# Patient Record
Sex: Female | Born: 1985
Health system: Southern US, Community
[De-identification: ages and names within clinical notes are randomized; demographics above are authoritative.]

## PROBLEM LIST (undated history)

## (undated) DIAGNOSIS — D649 Anemia, unspecified: Secondary | ICD-10-CM

## (undated) DIAGNOSIS — K219 Gastro-esophageal reflux disease without esophagitis: Secondary | ICD-10-CM

## (undated) HISTORY — DX: Gastro-esophageal reflux disease without esophagitis: K21.9

## (undated) HISTORY — DX: Anemia, unspecified: D64.9

---
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
## 2010-09-17 DIAGNOSIS — E559 Vitamin D deficiency, unspecified: Secondary | ICD-10-CM | POA: Insufficient documentation

## 2018-03-06 ENCOUNTER — Encounter: Payer: Self-pay | Admitting: Family Medicine

## 2018-03-06 ENCOUNTER — Ambulatory Visit (INDEPENDENT_AMBULATORY_CARE_PROVIDER_SITE_OTHER): Admitting: Family Medicine

## 2018-03-06 VITALS — BP 114/62 | HR 96 | Temp 98.6°F | Resp 16 | Ht 68.0 in | Wt 189.2 lb

## 2018-03-06 DIAGNOSIS — M25552 Pain in left hip: Secondary | ICD-10-CM | POA: Diagnosis not present

## 2018-03-06 DIAGNOSIS — L659 Nonscarring hair loss, unspecified: Secondary | ICD-10-CM | POA: Diagnosis not present

## 2018-03-06 LAB — CBC
HCT: 37.2 % (ref 36.0–46.0)
Hemoglobin: 12.3 g/dL (ref 12.0–15.0)
MCHC: 33 g/dL (ref 30.0–36.0)
MCV: 92 fl (ref 78.0–100.0)
Platelets: 255 10*3/uL (ref 150.0–400.0)
RBC: 4.04 Mil/uL (ref 3.87–5.11)
RDW: 13.8 % (ref 11.5–15.5)
WBC: 6.5 10*3/uL (ref 4.0–10.5)

## 2018-03-06 LAB — TSH: TSH: 0.9 u[IU]/mL (ref 0.35–4.50)

## 2018-03-06 LAB — IBC PANEL
IRON: 143 ug/dL (ref 42–145)
SATURATION RATIOS: 21.8 % (ref 20.0–50.0)
Transferrin: 468 mg/dL — ABNORMAL HIGH (ref 212.0–360.0)

## 2018-03-06 LAB — FERRITIN: FERRITIN: 7.6 ng/mL — AB (ref 10.0–291.0)

## 2018-03-06 NOTE — Patient Instructions (Addendum)
Ice/cold pack over area for 10-15 min twice daily.  Ibuprofen 400-600 mg (2-3 over the counter strength tabs) every 6 hours as needed for pain.  OK to take Tylenol 1000 mg (2 extra strength tabs) or 975 mg (3 regular strength tabs) every 6 hours as needed.  Do not take before working out. Listen to your body. Avoid irritating activities over the next 4-6 weeks. If no better, send me a message and we will get you set up with physical therapy.  Try biotin if your labs are normal. Wait until the results of your labs come back.  Give [place] 2-3 business days to get the results of your labs back. If labs are normal, you will likely receive a letter in the mail unless you have MyChart. This can take longer than 2-3 business days.    Hip Exercises It is normal to feel mild stretching, pulling, tightness, or discomfort as you do these exercises, but you should stop right away if you feel sudden pain or your pain gets worse.  STRETCHING AND RANGE OF MOTION EXERCISES These exercises warm up your muscles and joints and improve the movement and flexibility of your hip. These exercises also help to relieve pain, numbness, and tingling. Exercise A: Hamstrings, Supine  1. Lie on your back. 2. Loop a belt or towel over the ball of your left / rightfoot. The ball of your foot is on the walking surface, right under your toes. 3. Straighten your left / rightknee and slowly pull on the belt to raise your leg. ? Do not let your left / right knee bend while you do this. ? Keep your other leg flat on the floor. ? Raise the left / right leg until you feel a gentle stretch behind your left / right knee or thigh. 4. Hold this position for 30 seconds. 5. Slowly return your leg to the starting position. Repeat2 times. Complete this stretch 3 times per week. Exercise B: Hip Rotators  1. Lie on your back on a firm surface. 2. Hold your left / right knee with your left / right hand. Hold your ankle with your other  hand. 3. Gently pull your left / right knee and rotate your lower leg toward your other shoulder. ? Pull until you feel a stretch in your buttocks. ? Keep your hips and shoulders firmly planted while you do this stretch. 4. Hold this position for 30 seconds. Repeat 2 times. Complete this stretch 3 times per week. Exercise C: V-Sit (Hamstrings and Adductors)  1. Sit on the floor with your legs extended in a large "V" shape. Keep your knees straight during this exercise. 2. Start with your head and chest upright, then bend at your waist to reach for your left foot (position A). You should feel a stretch in your right inner thigh. 3. Hold this position for 30 seconds. Then slowly return to the upright position. 4. Bend at your waist to reach forward (position B). You should feel a stretch behind both of your thighs and knees. 5. Hold this position for 30 seconds. Then slowly return to the upright position. 6. Bend at your waist to reach for your right foot (position C). You should feel a stretch in your left inner thigh. 7. Hold this position for 30 seconds. Then slowly return to the upright position. Repeat A, B, and C 2 times each. Complete this stretch 3 times per week. Exercise D: Lunge (Hip Flexors)  1. Place your left / right  knee on the floor and bend your other knee so that is directly over your ankle. You should be half-kneeling. 2. Keep good posture with your head over your shoulders. 3. Tighten your buttocks to point your tailbone downward. This helps your back to keep from arching too much. 4. You should feel a gentle stretch in the front of your left / right thigh and hip. If you do not feel any resistance, slightly slide your other foot forward and then slowly lunge forward so your knee once again lines up over your ankle. 5. Make sure your tailbone continues to point downward. 6. Hold this position for 30 seconds. Repeat 2 times. Complete this stretch 3 times per  week.  STRENGTHENING EXERCISES These exercises build strength and endurance in your hip. Endurance is the ability to use your muscles for a long time, even after they get tired. Exercise E: Bridge (Hip Extensors)  1. Lie on your back on a firm surface with your knees bent and your feet flat on the floor. 2. Tighten your buttocks muscles and lift your bottom off the floor until the trunk of your body is level with your thighs. ? Do not arch your back. ? You should feel the muscles working in your buttocks and the back of your thighs. If you do not feel these muscles, slide your feet 1-2 inches (2.5-5 cm) farther away from your buttocks. 3. Hold this position for 3 seconds. 4. Slowly lower your hips to the starting position. Repeat for a total of 10 repetitions. 5. Let your muscles relax completely between repetitions. 6. If this exercise is too easy, try doing it with your arms crossed over your chest. Repeat 2 times. Complete this exercise 3 times per week. Exercise F: Straight Leg Raises - Hip Abductors  1. Lie on your side with your left / right leg in the top position. Lie so your head, shoulder, knee, and hip line up with each other. You may bend your bottom knee to help you balance. 2. Roll your hips slightly forward, so your hips are stacked directly over each other and your left / right knee is facing forward. 3. Leading with your heel, lift your top leg 4-6 inches (10-15 cm). You should feel the muscles in your outer hip lifting. ? Do not let your foot drift forward. ? Do not let your knee roll toward the ceiling. 4. Hold this position for 1 second. 5. Slowly return to the starting position. 6. Let your muscles relax completely between repetitions. Repeat for a total of 10 repetitions.  Repeat 2 times. Complete this exercise 3 times per week. Exercise G: Straight Leg Raises - Hip Adductors  1. Lie on your side with your left / right leg in the bottom position. Lie so your head,  shoulder, knee, and hip line up. You may place your upper foot in front to help you balance. 2. Roll your hips slightly forward, so your hips are stacked directly over each other and your left / right knee is facing forward. 3. Tense the muscles in your inner thigh and lift your bottom leg 4-6 inches (10-15 cm). 4. Hold this position for 1 second. 5. Slowly return to the starting position. 6. Let your muscles relax completely between repetitions. Repeat for a total of 10 repetitions. Repeat 2 times. Complete this exercise 3 times per week. Exercise H: Straight Leg Raises - Quadriceps  1. Lie on your back with your left / right leg extended and your  other knee bent. 2. Tense the muscles in the front of your left / right thigh. When you do this, you should see your kneecap slide up or see increased dimpling just above your knee. 3. Tighten these muscles even more and raise your leg 4-6 inches (10-15 cm) off the floor. 4. Hold this position for 3 seconds. 5. Keep these muscles tense as you lower your leg. 6. Relax the muscles slowly and completely between repetitions. Repeat for a total of 10 repetitions. Repeat 2 times. Complete this exercise 3 times per week. Exercise I: Hip Abductors, Standing 1. Tie one end of a rubber exercise band or tubing to a secure surface, such as a table or pole. 2. Loop the other end of the band or tubing around your left / right ankle. 3. Keeping your ankle with the band or tubing directly opposite of the secured end, step away until there is tension in the tubing or band. Hold onto a chair as needed for balance. 4. Lift your left / right leg out to your side. While you do this: ? Keep your back upright. ? Keep your shoulders over your hips. ? Keep your toes pointing forward. ? Make sure to use your hip muscles to lift your leg. Do not "throw" your leg or tip your body to lift your leg. 5. Hold this position for 1 second. 6. Slowly return to the starting  position. Repeat for a total of 10 repetitions. Repeat 2 times. Complete this exercise 3 times per week. Exercise J: Squats (Quadriceps) 1. Stand in a door frame so your feet and knees are in line with the frame. You may place your hands on the frame for balance. 2. Slowly bend your knees and lower your hips like you are going to sit in a chair. ? Keep your lower legs in a straight-up-and-down position. ? Do not let your hips go lower than your knees. ? Do not bend your knees lower than told by your health care provider. ? If your hip pain increases, do not bend as low. 3. Hold this position for 1 second. 4. Slowly push with your legs to return to standing. Do not use your hands to pull yourself to standing. Repeat for a total of 10 repetitions. Repeat 2 times. Complete this exercise 3 times per week. Make sure you discuss any questions you have with your health care provider. Document Released: 09/03/2005 Document Revised: 05/10/2016 Document Reviewed: 08/11/2015 Elsevier Interactive Patient Education  [employer]2018 [employer]

## 2018-03-06 NOTE — Progress Notes (Signed)
Chief Complaint  Patient presents with  . Establish Care    PT let hip poppoing and painful        New Patient Visit SUBJECTIVE: HPI: Kiara Norman is an 31 y.o.female who is being seen for establishing care.  The patient was previously seen at only her GYN's office.  Ever since her 78-month-old son was born, she has been experiencing thinning of her hair.  At first she thought it was related to the hormonal changes from her pregnancy.  Her Mirena was placed shortly after she gave birth.  She had abnormal bleeding for the entire time until it was removed 3 months ago.  Her cycles have been back to normal since going on oral contraceptives.  She does not pull her hair tightly.  She states that her diet is normal otherwise.  No other areas of easy bruising or bleeding.  The patient does has a history of anemia.  She is not currently taking any iron.  She has not tried anything over-the-counter for this.  She has never had her labs checked regarding this.  L hip in the front has been popping for years, 2 weeks ago has been having a pain with it. Has tried NSAIDs and heating pad.  Family history is positive for arthritis in the hips.  No injury or change in activity otherwise.  No new footwear.  When she does Frankenstein's, it will cause pain.  Running does not affect this.  No numbness, tingling, or weakness.  The hip does not catch or lock.  No outer her hip pain.   Past Medical History:  Diagnosis Date  . Anemia    Past Surgical History:  Procedure Laterality Date  . CESAREAN SECTION     x2   Family History  Problem Relation Age of Onset  . Arthritis Mother   . Arthritis Paternal Grandfather     Current Outpatient Medications:  .  norgestimate-ethinyl estradiol (ESTARYLLA) 0.25-35 MG-MCG tablet, Take 1 tablet by mouth daily., Disp: 1 Package, Rfl: 11  ROS Heme: Denies easy bleeding  MSK: +hip pain   OBJECTIVE: BP 114/62   Pulse 96   Temp 98.6 F (37 C) (Oral)   Resp 16    Ht 5\' 8"  (1.727 m)   Wt 189 lb 3.2 oz (85.8 kg)   SpO2 96%   BMI 28.77 kg/m   Constitutional: -  VS reviewed -  Well developed, well nourished, appears stated age -  No apparent distress  Psychiatric: -  Oriented to person, place, and time -  Memory intact -  Affect and mood normal -  Fluent conversation, good eye contact -  Judgment and insight age appropriate  Eye: -  Conjunctivae clear, no discharge -  Pupils symmetric, round, reactive to light  ENMT: -  MMM    Pharynx moist, no exudate, no erythema  Neck: -  No gross swelling, no palpable masses -  Thyroid midline, not enlarged, mobile, no palpable masses  Cardiovascular: -  RRR -  No LE edema  Respiratory: -  Normal respiratory effort, no accessory muscle use, no retraction -  Breath sounds equal, no wheezes, no ronchi, no crackles  Gastrointestinal: -  Bowel sounds normal -  No tenderness, no distention, no guarding, no masses  Neurological:  -  CN II - XII grossly intact -  Sensation grossly intact to light touch, equal bilaterally  Musculoskeletal: -  No clubbing, no cyanosis -  Gait normal -  L hip- No ttp;  ROM appears wnl, Neg Stinchfield, log roll, Ober's, FABBER, FADDIR  Skin: -  No significant lesion on inspection -  Hairline is intact, hair around appears to be thinning, no alopecia -  Warm and dry to palpation   ASSESSMENT/PLAN: Hair thinning - Plan: CBC, Ferritin, IBC panel, TSH  Pain of left hip joint  Orders as above. If labs neg, try biotin.   Stretches/exercises, NSAIDs, Tylenol, ice. Activity as tolerated. Patient should return at earliest convenience for CPE. The patient voiced understanding and agreement to the plan.   Jilda Rocheicholas Paul BirchwoodWendling, DO 03/06/18  10:39 AM

## 2018-04-21 ENCOUNTER — Ambulatory Visit (INDEPENDENT_AMBULATORY_CARE_PROVIDER_SITE_OTHER): Admitting: Family Medicine

## 2018-04-21 ENCOUNTER — Encounter: Payer: Self-pay | Admitting: Family Medicine

## 2018-04-21 VITALS — BP 110/70 | HR 73 | Temp 98.7°F | Ht 67.0 in | Wt 185.2 lb

## 2018-04-21 DIAGNOSIS — M25552 Pain in left hip: Secondary | ICD-10-CM

## 2018-04-21 DIAGNOSIS — Z0000 Encounter for general adult medical examination without abnormal findings: Secondary | ICD-10-CM | POA: Diagnosis not present

## 2018-04-21 LAB — LIPID PANEL
CHOLESTEROL: 179 mg/dL (ref 0–200)
HDL: 56.2 mg/dL (ref >39.00)
LDL Cholesterol: 112 mg/dL — ABNORMAL HIGH (ref 0–99)
NonHDL: 122.38
Total CHOL/HDL Ratio: 3
Triglycerides: 52 mg/dL (ref 0.0–149.0)
VLDL: 10.4 mg/dL (ref 0.0–40.0)

## 2018-04-21 LAB — COMPREHENSIVE METABOLIC PANEL
ALBUMIN: 4.2 g/dL (ref 3.5–5.2)
ALK PHOS: 38 U/L — AB (ref 39–117)
ALT: 21 U/L (ref 0–35)
AST: 22 U/L (ref 0–37)
BILIRUBIN TOTAL: 0.5 mg/dL (ref 0.2–1.2)
BUN: 14 mg/dL (ref 6–23)
CO2: 29 mEq/L (ref 19–32)
CREATININE: 0.85 mg/dL (ref 0.40–1.20)
Calcium: 9.5 mg/dL (ref 8.4–10.5)
Chloride: 104 mEq/L (ref 96–112)
GFR: 83.38 mL/min (ref >60.00)
Glucose, Bld: 91 mg/dL (ref 70–99)
Potassium: 4.5 mEq/L (ref 3.5–5.1)
SODIUM: 138 meq/L (ref 135–145)
TOTAL PROTEIN: 6.9 g/dL (ref 6.0–8.3)

## 2018-04-21 NOTE — Patient Instructions (Addendum)
Stay active and keep the diet clean.  Heat (pad or rice pillow in microwave) over affected area, 10-15 minutes twice daily.   If you do not hear anything about your physical therapy referral in the next 1-2 weeks, call our office and ask for an update.  Give us 2-3 business days to get the results of your labs back.  Ferrous sulfate (over the counter iron) 3 times daily if you can tolerate it. If your hair does not improve over next few months, come back and we will check your iron levels again.   Let us know if you need anything.

## 2018-04-21 NOTE — Progress Notes (Signed)
Chief Complaint  Patient presents with  . Annual Exam     Well Woman Kiara Norman is here for a complete physical.   Her last physical was >1 year ago.  Current diet: in general, a "healthy" diet. Current exercise: Lifting weights, cardio. No LMP recorded. Seatbelt? Yes  Health Maintenance Pap/HPV- Yes 10/28/17- follows with GYN Tetanus- Yes - 3 years ago HIV screening- Yes   Still having pain in L hip. Was improving, compliant with rest and stretches/exercises. Lifted several days ago and got worse. Hurts in groin region and over bone in bottom.  Past Medical History:  Diagnosis Date  . Anemia     Past Surgical History:  Procedure Laterality Date  . CESAREAN SECTION     x2    Medications  Current Outpatient Medications on File Prior to Visit  Medication Sig Dispense Refill  . norgestimate-ethinyl estradiol (ESTARYLLA) 0.25-35 MG-MCG tablet Take 1 tablet by mouth daily. 1 Package 11   Allergies No Known Allergies  Review of Systems: Constitutional:  no unexpected weight changes Eye:  no recent significant change in vision Ear/Nose/Mouth/Throat:  Ears:  no tinnitus or vertigo and no recent change in hearing Nose/Mouth/Throat:  no complaints of nasal congestion, no sore throat Cardiovascular: no chest pain Respiratory:  no cough and no shortness of breath Gastrointestinal:  no abdominal pain, no change in bowel habits GU:  Female: negative for dysuria or pelvic pain Musculoskeletal/Extremities: +L hip pain; otherwise no pain of the joints Integumentary (Skin/Breast):  no abnormal skin lesions reported Neurologic:  no headaches Endocrine:  denies fatigue Hematologic/Lymphatic:  No areas of easy bleeding  Exam BP 110/70 (BP Location: Left Arm, Patient Position: Sitting, Cuff Size: Normal)   Pulse 73   Temp 98.7 F (37.1 C) (Oral)   Ht 5\' 7"  (1.702 m)   Wt 185 lb 4 oz (84 kg)   SpO2 99%   BMI 29.01 kg/m  General:  well developed, well nourished, in no  apparent distress Skin:  no significant moles, warts, or growths Head:  no masses, lesions, or tenderness Eyes:  pupils equal and round, sclera anicteric without injection Ears:  canals without lesions, TMs shiny without retraction, no obvious effusion, no erythema Nose:  nares patent, septum midline, mucosa normal, and no drainage or sinus tenderness Throat/Pharynx:  lips and gingiva without lesion; tongue and uvula midline; non-inflamed pharynx; no exudates or postnasal drainage Neck: neck supple without adenopathy, thyromegaly, or masses Lungs:  clear to auscultation, breath sounds equal bilaterally, no respiratory distress Cardio:  regular rate and rhythm, no bruits, no LE edema Abdomen:  abdomen soft, nontender; bowel sounds normal; no masses or organomegaly Genital: Defer to GYN Musculoskeletal: L hip: +Stinchfield, +ttp over isch tuberosity on L, neg FABER/FADDIR, logroll, Ober's; symmetrical muscle groups noted without atrophy or deformity Extremities:  no clubbing, cyanosis, or edema, no deformities, no skin discoloration Neuro:  gait normal; deep tendon reflexes normal and symmetric Psych: well oriented with normal range of affect and appropriate judgment/insight  Assessment and Plan  Well adult exam - Plan: Comprehensive metabolic panel, Lipid panel  Pain of left hip joint - Plan: Ambulatory referral to Physical Therapy   Well 32 y.o. female. Counseled on diet and exercise. Needs to take iron.  Start PT, if no improvement will get MRI. F/u in 6 weeks for this. Other orders as above. Follow up in 1 year or prn. The patient voiced understanding and agreement to the plan.  Jilda Rocheicholas Paul CoalingWendling, DO 04/21/18 9:00 AM

## 2018-04-21 NOTE — Progress Notes (Signed)
Pre visit review using our clinic review tool, if applicable. No additional management support is needed unless otherwise documented below in the visit note. 

## 2018-05-04 ENCOUNTER — Telehealth: Payer: Self-pay | Admitting: Family Medicine

## 2018-05-04 NOTE — Telephone Encounter (Signed)
Pt was contacted to schedule appt, mailbox was full, office was unable to leave msg. I contacted the pt provided her with the number to contact them directly to schedule her appt

## 2018-05-04 NOTE — Telephone Encounter (Signed)
Copied from CRM [id number]. Topic: General - Other >> May 04, 2018  9:31 AM Tamela Oddi wrote: Reason for CRM: Patient called to check the status of her referral for PT.  Patient states that she has not heard anything regarding the referral.  CB# [phone]

## 2018-05-26 ENCOUNTER — Ambulatory Visit (HOSPITAL_BASED_OUTPATIENT_CLINIC_OR_DEPARTMENT_OTHER)
Admission: RE | Admit: 2018-05-26 | Discharge: 2018-05-26 | Disposition: A | Discharge status: Home / Self Care | Source: Ambulatory Visit | Attending: Family Medicine | Admitting: Family Medicine

## 2018-05-26 ENCOUNTER — Ambulatory Visit (INDEPENDENT_AMBULATORY_CARE_PROVIDER_SITE_OTHER): Admitting: Family Medicine

## 2018-05-26 ENCOUNTER — Encounter: Payer: Self-pay | Admitting: Family Medicine

## 2018-05-26 VITALS — BP 125/82 | HR 64 | Ht 67.0 in | Wt 188.0 lb

## 2018-05-26 DIAGNOSIS — M25552 Pain in left hip: Secondary | ICD-10-CM | POA: Diagnosis present

## 2018-05-26 NOTE — Patient Instructions (Signed)
Get x-rays downstairs as you leave today - we will contact you with results, i'm hopeful and expecting these to be normal. Assuming they are start physical therapy for snapping hip, possible labral tear. Icing 15 minutes at a time as needed. Do home exercises on days you don't go to therapy. Medications are unlikely to make this better faster but you can take tylenol and/or aleve as needed for pain. No PT test - we will reevaluate how you're doing when I see you back. Consider MRI arthrogram of your hip if you're not improving as expected. Follow up with me in 6 weeks.

## 2018-05-28 ENCOUNTER — Encounter: Payer: Self-pay | Admitting: Family Medicine

## 2018-05-28 NOTE — Progress Notes (Signed)
PCP and consultation requested by: Sharlene Dory, DO  Subjective:   HPI: Patient is a 32 y.o. female here for left hip pain.  Patient reports she's had popping of her left hip for about 2 years but didn't start getting pain with this until about 3-4 months ago. Now bothering with running and walking especially. Pain level 2/10, can be sharp. No radiation beyond groin area. Has rare back pain but feels it's unrelated and does not occur with her hip pain. No numbness or tingling. No skin changes.  Past Medical History:  Diagnosis Date  . Anemia     Current Outpatient Medications on File Prior to Visit  Medication Sig Dispense Refill  . norgestimate-ethinyl estradiol (ESTARYLLA) 0.25-35 MG-MCG tablet Take 1 tablet by mouth daily. 1 Package 11   No current facility-administered medications on file prior to visit.     Past Surgical History:  Procedure Laterality Date  . CESAREAN SECTION     x2    No Known Allergies  Social History   Socioeconomic History  . Marital status: Single    Spouse name: Not on file  . Number of children: Not on file  . Years of education: Not on file  . Highest education level: Not on file  Occupational History  . Not on file  Social Needs  . Financial resource strain: Not on file  . Food insecurity:    Worry: Not on file    Inability: Not on file  . Transportation needs:    Medical: Not on file    Non-medical: Not on file  Tobacco Use  . Smoking status: Never Smoker  . Smokeless tobacco: Never Used  Substance and Sexual Activity  . Alcohol use: Not Currently  . Drug use: Never  . Sexual activity: Not on file  Lifestyle  . Physical activity:    Days per week: Not on file    Minutes per session: Not on file  . Stress: Not on file  Relationships  . Social connections:    Talks on phone: Not on file    Gets together: Not on file    Attends religious service: Not on file    Active member of club or organization: Not on  file    Attends meetings of clubs or organizations: Not on file    Relationship status: Not on file  . Intimate partner violence:    Fear of current or ex partner: Not on file    Emotionally abused: Not on file    Physically abused: Not on file    Forced sexual activity: Not on file  Other Topics Concern  . Not on file  Social History Narrative  . Not on file    Family History  Problem Relation Age of Onset  . Arthritis Mother   . Arthritis Paternal Grandfather     BP 125/82   Pulse 64   Ht 5\' 7"  (1.702 m)   Wt 188 lb (85.3 kg)   LMP 05/14/2018   BMI 29.44 kg/m   Review of Systems: See HPI above.     Objective:  Physical Exam:  Gen: NAD, comfortable in exam room  Back: No gross deformity, scoliosis. No paraspinal TTP .  No midline or bony TTP. FROM. Strength LEs 5/5 all muscle groups.   2+ MSRs in patellar and achilles tendons, equal bilaterally. Negative SLRs. Sensation intact to light touch bilaterally.  Left hip: No deformity. FROM with 5/5 strength. No tenderness to palpation. NVI distally. Mild  pain with logroll Negative fabers and piriformis stretches.  Assessment & Plan:  1. Left hip pain - independently reviewed radiographs and no evidence FAI.  Her pain is anterior with associated popping and some pain with logroll.  We discussed snapping hip vs labral tear.  She will start physical therapy and do home exercises for this.  Icing.  Tylenol and/or ibuprofen as needed.  Consider MRI arthrogram if doesn't respond to conservative treatment.  F/u in 6 weeks.

## 2018-06-01 ENCOUNTER — Telehealth: Payer: Self-pay

## 2018-06-01 NOTE — Telephone Encounter (Signed)
Patient will come in tomorrow to pick up office notes.

## 2018-06-01 NOTE — Telephone Encounter (Signed)
Will put in envelope at the front desk.

## 2018-06-01 NOTE — Telephone Encounter (Signed)
Copied from CRM [id number]. Topic: General - Other >> May 26, 2018  2:04 PM Angela Nevin wrote: Reason for CRM: Pt called requesting office notes from her visits with Dr. Carmelia Roller stating that she needs them for [employer] profile. Please advise.

## 2018-06-01 NOTE — Telephone Encounter (Signed)
Ok to provide//has only been 2 visits

## 2018-06-01 NOTE — Telephone Encounter (Signed)
OK 

## 2018-06-01 NOTE — Telephone Encounter (Signed)
Printed both office visit notes//called the patient left message to call back,

## 2018-06-02 ENCOUNTER — Ambulatory Visit: Admitting: Family Medicine

## 2018-06-13 ENCOUNTER — Encounter: Payer: Self-pay | Admitting: Physical Therapy

## 2018-06-13 ENCOUNTER — Ambulatory Visit: Attending: Family Medicine | Admitting: Physical Therapy

## 2018-06-13 ENCOUNTER — Other Ambulatory Visit: Payer: Self-pay

## 2018-06-13 DIAGNOSIS — M25652 Stiffness of left hip, not elsewhere classified: Secondary | ICD-10-CM

## 2018-06-13 DIAGNOSIS — M6281 Muscle weakness (generalized): Secondary | ICD-10-CM | POA: Diagnosis present

## 2018-06-13 DIAGNOSIS — M25552 Pain in left hip: Secondary | ICD-10-CM

## 2018-06-13 NOTE — Patient Instructions (Signed)
         Garen Lah, PT Certified Exercise Expert for the Aging Adult  06/13/18 12:31 PM Phone: [phone] Fax: [phone]

## 2018-06-13 NOTE — Therapy (Addendum)
[facility] Outpatient Rehabilitation [facility] [address] [place], [place], [address] Phone: [phone]   Fax:  [phone]  Physical Therapy Evaluation  Patient Details  Name: Kiara Norman MRN: [id number] Date of Birth: March 06, 1986 Referring Provider (PT): Cathlyn Parsons, MD   Encounter Date: 06/13/2018  PT End of Session - 06/13/18 1248    Visit Number  1    Number of Visits  13    Date for PT Re-Evaluation  07/25/18    Authorization Type  TRICARE  PT only    PT Start Time  1145    PT Stop Time  1235    PT Time Calculation (min)  50 min    Activity Tolerance  Patient tolerated treatment well    Behavior During Therapy  [facility] for tasks assessed/performed       Past Medical History:  Diagnosis Date  . Anemia     Past Surgical History:  Procedure Laterality Date  . CESAREAN SECTION     x2    There were no vitals filed for this visit.   Subjective Assessment - 06/13/18 1149    Subjective  I am in the [facility]. I have been having left hip pain for 2 months. but the snapping and popping has been happening for about a year. 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  unlimited    How long can you stand comfortably?  standing still is fine    How long can you walk comfortably?  < 5 mnintues   with walking or runniing   Diagnostic tests  x rays normal     Patient Stated Goals  goals return to weight lifting and running    Currently in Pain?  Yes    Pain Score  9    2/10 at rest but higher when running   Pain Location  Hip    Pain Orientation  Left    Pain Descriptors / Indicators  Burning;Sharp   with running   Pain Type  Chronic pain    Pain Radiating Towards  radiates into my buttock next to ischial tuberosity, popping anteriorly    Pain Onset  More than a month ago    Pain  Frequency  Intermittent    Aggravating Factors   running, any hip flexor for sit ups     Pain Relieving Factors  try to stretch         [facility] PT Assessment - 06/13/18 1155      Assessment   Medical Diagnosis  left hip pain    Referring Provider (PT)  Cathlyn Parsons, MD    Onset Date/Surgical Date  04/13/18   popping and clicking for a year   Hand Dominance  Right    Next MD Visit  July 05, 2018    Prior Therapy  none      Precautions   Precautions  None      Restrictions   Weight Bearing Restrictions  No      Balance Screen   Has the patient fallen in the past 6 months  No    Has the patient had a decrease in activity level because of a fear of falling?   No    Is the patient reluctant to leave their home because of a fear of  falling?   No      Home Environment   Living Environment  Private residence    Living Arrangements  Children    Type of Home  Apartment    Home Access  Stairs to enter    Entrance Stairs-Number of Steps  14      Prior Function   Level of Independence  Independent    Vocation  Full time employment    Vocation Requirements  LPN [employer] also in the [employer]      Cognition   Overall Cognitive Status  Within Functional Limits for tasks assessed      Observation/Other Assessments   Focus on Therapeutic Outcomes (FOTO)   FOTO intake 50% limtation 50% predicted 28%      Sensation   Light Touch  Appears Intact      Functional Tests   Functional tests  Single leg stance;Running;Squat;Lunges      Squat   Comments  Pt able to squat 90 degrees hip flexion but mentions tightness on left hip      Lunges   Comments  unable to do due to pain in left hip today      Running   Comments  unable to run due to left hip pain      Single Leg Stance   Comments  right 30 sec no pain, left 30 sec but constant aching       ROM / Strength   AROM / PROM / Strength  AROM;Strength      AROM   Overall AROM   Deficits    Right  Hip Extension  10    Right Hip Flexion  120    Right Hip External Rotation   35    Right Hip Internal Rotation   45    Right Hip ABduction  48    Right Hip ADduction  30    Left Hip Extension  0   pain on stretch hip flexor   Left Hip Flexion  110    Left Hip External Rotation   34    Left Hip Internal Rotation   25    Left Hip ABduction  40    Left Hip ADduction  20   pain on groin     Strength   Overall Strength  Deficits    Right Hip Flexion  5/5    Right Hip Extension  5/5    Right Hip External Rotation   5/5    Right Hip Internal Rotation  5/5    Right Hip ABduction  5/5    Left Hip Flexion  4+/5    Left Hip Extension  4-/5   pain in left hip flexor preventing full range   Left Hip External Rotation  4/5    Left Hip Internal Rotation  4/5    Left Hip ABduction  4/5    Right Knee Flexion  5/5    Right Knee Extension  5/5    Left Knee Flexion  5/5    Left Knee Extension  5/5      Palpation   Palpation comment  ER left tightness, pt with hip tightness with ROM and popping clicking with left hip IR and ER. Pain with extension after PROM of hip flexion      Special Tests    Special Tests  Hip Special Tests      Luisa Hart [facility]) Test   Findings  Positive    Side  Left  Comments  Pt also has clicking poping with maneuver      Thomas Test    Findings  Positive    Side  Left    Comments  Pt left hip 20 degrees from horizontal mat.  Pain in hip flexor      Anterior Hip Impingement Test    Findings  Positive    Side   Left    Comments  x ray negative,                 Objective measurements completed on examination: See above findings.      [facility] Adult PT Treatment/Exercise - 06/13/18 0001      Knee/Hip Exercises: Stretches   Other Knee/Hip Stretches  single limb bridge for stretch in painfree motion with left leg extended and posterior tilt x 3 20-30 sec hold       Knee/Hip Exercises: Sidelying   Clams  sidelyng on right 2 x 10 without resistiance  and pain free    Other Sidelying Knee/Hip Exercises  side plank on knees left 2 x 10    Other Sidelying Knee/Hip Exercises  quadriped fire hydrant 2 x 10 left             PT Education - 06/13/18 1257    Education Details  POC Explanation of finding with skeletal model, initial HEP without resistance and pain free    Person(s) Educated  Patient    Methods  Explanation;Demonstration;Verbal cues;Handout;Tactile cues    Comprehension  Verbalized understanding;Returned demonstration       PT Short Term Goals - 06/13/18 1304      PT SHORT TERM GOAL #1   Title  Pt will be independent with initial HEP    Time  2    Period  Weeks    Status  New    Target Date  06/27/18      PT SHORT TERM GOAL #2   Title  Pt will be able to perform hip flexor stretch pain free    Baseline  unable to perform without pain and decreased AROM    Time  2    Period  Weeks    Status  New    Target Date  06/27/18        PT Long Term Goals - 06/13/18 1232      PT LONG TERM GOAL #1   Title  Pt will be independent with advanced HEP    Time  6    Period  Weeks    Status  New    Target Date  07/25/18      PT LONG TERM GOAL #2   Title  Pt will be able to perform hip flexor stretch without pain    Time  6    Period  Weeks    Status  New    Target Date  07/25/18      PT LONG TERM GOAL #3   Title  "Pt will improve L hip flexor strength to >/= 4+/5 with </= 2/10 pain to promote safety with walking/standing activities    Time  6    Period  Weeks    Status  New    Target Date  07/25/18      PT LONG TERM GOAL #4   Title  Pt will be able to return to weight lifting activities utiilizing safety and hip joint protections    Time  6    Period  Weeks    Status  New    Target Date  07/25/18      PT LONG TERM GOAL #5   Title  "FOTO will improve from 50% limitation    to  28 % limtation   indicating improved functional mobility .     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  Pt saw Dr Pearletha Forge and FAI  was rulled out but clicking and popping with special tests indication of left hip labral involvment..  Pt  will benefit from skilled PT to address pain in left hip for trial before pursuing further work up. Pt unable to tolerate any resistance with exercises and has very tight left hip flexor.     Clinical Presentation  Stable    Clinical Decision Making  Low    Rehab Potential  Good    PT Frequency  2x / week    PT Duration  6 weeks    PT Treatment/Interventions  Cryotherapy;Iontophoresis 4mg /ml Dexamethasone;Moist Heat;Ultrasound;Therapeutic exercise;Therapeutic activities;Functional mobility training;Neuromuscular re-education;Patient/family education;Manual techniques;Passive range of motion;Dry needling;Taping;Joint Manipulations    PT Next Visit Plan  review pain free exercise.  Check pelvic levels, Give flexibility as pt can tolerate    PT Home Exercise Plan  HEP without resisistance.  single limb bridge  with post. tilt for pain free left hip flex. clams, side plank, fire hydrant    Consulted and Agree with Plan of Care  Patient       Patient will benefit from skilled therapeutic intervention in order to improve the following deficits and impairments:  Decreased activity tolerance, Decreased mobility, Decreased range of motion, Decreased strength, Impaired flexibility, Pain  Visit Diagnosis: Pain in left hip - Plan: PT plan of care cert/re-cert  Stiffness of left hip, not elsewhere classified - Plan: PT plan of care cert/re-cert  Muscle weakness (generalized) - Plan: PT plan of care cert/re-cert     Problem List Patient Active Problem List   Diagnosis Date Noted  . Hair thinning 03/06/2018  . Pain of left hip  joint 03/06/2018    Garen Lah, PT Certified Exercise Expert for the Aging Adult  06/13/18 5:25 PM Phone: [phone] Fax: [phone]  [facility] Outpatient Rehabilitation [facility] [address] [place], [place], [address] Phone: [phone]   Fax:  [phone]  Name: Maryah Marinaro MRN: [id number] Date of Birth: 11/19/1985

## 2018-06-21 ENCOUNTER — Ambulatory Visit

## 2018-06-21 DIAGNOSIS — M25552 Pain in left hip: Secondary | ICD-10-CM

## 2018-06-21 DIAGNOSIS — M6281 Muscle weakness (generalized): Secondary | ICD-10-CM

## 2018-06-21 DIAGNOSIS — M25652 Stiffness of left hip, not elsewhere classified: Secondary | ICD-10-CM

## 2018-06-21 NOTE — Therapy (Signed)
[facility] Outpatient Rehabilitation [facility] [address] [place], [place], [address] Phone: [phone]   Fax:  [phone]  Physical Therapy Treatment  Patient Details  Name: Kiara Norman MRN: [id number] Date of Birth: 08-18-1986 Referring Provider (PT): Cathlyn Parsons, MD   Encounter Date: 06/21/2018  PT End of Session - 06/21/18 1634    Visit Number  2    Number of Visits  13    Date for PT Re-Evaluation  07/25/18    Authorization Type  TRICARE  PT only    PT Start Time  1545    PT Stop Time  1625    PT Time Calculation (min)  40 min    Activity Tolerance  Patient tolerated treatment well    Behavior During Therapy  [facility] for tasks assessed/performed       Past Medical History:  Diagnosis Date  . Anemia     Past Surgical History:  Procedure Laterality Date  . CESAREAN SECTION     x2    There were no vitals filed for this visit.  Subjective Assessment - 06/21/18 1547    Subjective  Been doing exercises every day.  Nothing aggravating    Pain Score  4     Pain Location  Hip    Pain Orientation  Left    Pain Descriptors / Indicators  Aching;Burning    Pain Type  Chronic pain                       [facility] Adult PT Treatment/Exercise - 06/21/18 1615      Exercises   Exercises  Knee/Hip      Knee/Hip Exercises: Stretches   Piriformis Stretch  2 reps;20 seconds   crossed leg stretch more for internal rotator stretch   Other Knee/Hip Stretches  single limb bridge for stretch in painfree motion with left leg extended and posterior tilt x 3 20-30 sec hold       Knee/Hip Exercises: Aerobic   Elliptical  5 min level 2 incline manual mode      Knee/Hip Exercises: Standing   Hip Abduction  1 set;Left;10 reps;Knee straight    Hip Extension  1 set;Left;10 reps;Knee straight    Lateral Step Up  1 set;Hand Hold: 2;10 reps;Step Height: 6"    Other Standing Knee Exercises  side stepping with red t-band legs straight and squat position     Other Standing Knee Exercises  standing on L R on pad facing rebounder red ball squat position x 20; then facing to R throwing ball to L x 20      Knee/Hip Exercises: Supine   Hip Adduction Isometric  Strengthening;1 set;5 reps   squishy ball between knees     Knee/Hip Exercises: Sidelying   Clams  sidelyng on right 10 without resistiance then x 10 with red t-band    Other Sidelying Knee/Hip Exercises  side plank on knees left x 10    Other Sidelying Knee/Hip Exercises  quadriped fire hydrant x 10 left      Manual Therapy   Manual Therapy  Muscle Energy Technique    Manual therapy comments  Noted L iliac crest lower than R, used hamstring for muscle energy to correct     Muscle Energy Technique  3 x 5 sec hold hamstring activation in hooklying             PT Education - 06/21/18 1634    Education Details  HEP and to not  progress to gym activities yet; wait to see how tolerated activities today    Person(s) Educated  Patient    Methods  Explanation;Demonstration;Handout    Comprehension  Verbalized understanding;Returned demonstration       PT Short Term Goals - 06/13/18 1304      PT SHORT TERM GOAL #1   Title  Pt will be independent with initial HEP    Time  2    Period  Weeks    Status  New    Target Date  06/27/18      PT SHORT TERM GOAL #2   Title  Pt will be able to perform hip flexor stretch pain free    Baseline  unable to perform without pain and decreased AROM    Time  2    Period  Weeks    Status  New    Target Date  06/27/18        PT Long Term Goals - 06/13/18 1232      PT LONG TERM GOAL #1   Title  Pt will be independent with advanced HEP    Time  6    Period  Weeks    Status  New    Target Date  07/25/18      PT LONG TERM GOAL #2   Title  Pt will be able to perform hip flexor stretch without pain    Time  6    Period  Weeks    Status  New    Target Date  07/25/18      PT LONG TERM GOAL #3   Title  "Pt will improve L hip flexor  strength to >/= 4+/5 with </= 2/10 pain to promote safety with walking/standing activities    Time  6    Period  Weeks    Status  New    Target Date  07/25/18      PT LONG TERM GOAL #4   Title  Pt will be able to return to weight lifting activities utiilizing safety and hip joint protections    Time  6    Period  Weeks    Status  New    Target Date  07/25/18      PT LONG TERM GOAL #5   Title  "FOTO will improve from 50% limitation    to  28 % limtation   indicating improved functional mobility .     Time  6    Period  Weeks    Status  New    Target Date  07/25/18            Plan - 06/21/18 1635    Clinical Impression Statement  Patient tolerating initial HEP without increased pain.  Continues to have popping when straightening out L hip from flexed position, but not with standing hip extension.  No popping noted on elliptical.  Able to progress to functional and weight loaded strengthening this session.  Patient eager to resume some gym activities.  Feel needs further assessment of progressing activities prior to resuming some gym activities.  She will continue to benefit from skilled PT to progress to goals.     PT Frequency  2x / week    PT Treatment/Interventions  Cryotherapy;Iontophoresis 4mg /ml Dexamethasone;Moist Heat;Ultrasound;Therapeutic exercise;Therapeutic activities;Functional mobility training;Neuromuscular re-education;Patient/family education;Manual techniques;Passive range of motion;Dry needling;Taping;Joint Manipulations    PT Next Visit Plan  see how tolerated HEP progression and standing functional strenghting.  determine if able to progress to some gym  activities with caution    PT Home Exercise Plan  HEP without resisistance.  single limb bridge  with post. tilt for pain free left hip flex. clams w/ red band, side plank, fire hydrant, figure 4 IR stretch, adductor squeeze    Consulted and Agree with Plan of Care  Patient       Patient will benefit from  skilled therapeutic intervention in order to improve the following deficits and impairments:     Visit Diagnosis: Stiffness of left hip, not elsewhere classified  Muscle weakness (generalized)  Pain in left hip     Problem List Patient Active Problem List   Diagnosis Date Noted  . Hair thinning 03/06/2018  . Pain of left hip joint 03/06/2018    Elray Mcgregor, PT 06/21/2018, 4:41 PM  [facility] [address] [place], [place], [address] Phone: [phone]   Fax:  [phone]  Name: Kailin Principato MRN: [id number] Date of Birth: 08-24-1986

## 2018-06-23 ENCOUNTER — Encounter

## 2018-06-27 ENCOUNTER — Ambulatory Visit: Admitting: Physical Therapy

## 2018-07-03 ENCOUNTER — Encounter: Payer: Self-pay | Admitting: Physical Therapy

## 2018-07-03 ENCOUNTER — Ambulatory Visit: Attending: Family Medicine | Admitting: Physical Therapy

## 2018-07-03 DIAGNOSIS — M25652 Stiffness of left hip, not elsewhere classified: Secondary | ICD-10-CM | POA: Insufficient documentation

## 2018-07-03 DIAGNOSIS — M25552 Pain in left hip: Secondary | ICD-10-CM | POA: Insufficient documentation

## 2018-07-03 DIAGNOSIS — M6281 Muscle weakness (generalized): Secondary | ICD-10-CM | POA: Insufficient documentation

## 2018-07-03 NOTE — Therapy (Addendum)
[place], [name], [address] Phone: [phone]   Fax:  [phone]  Physical Therapy Treatment  Patient Details  Name: Kiara Norman MRN: [id number] Date of Birth: 05-Jul-1986 Referring Provider (PT): Chryl Heck, MD   Encounter Date: 07/03/2018  PT End of Session - 07/03/18 1438    Visit Number  3    Number of Visits  13    Date for PT Re-Evaluation  07/25/18    Authorization Type  TRICARE  PT only    PT Start Time  1330    PT Stop Time  1415    PT Time Calculation (min)  45 min    Activity Tolerance  Patient tolerated treatment well    Behavior During Therapy  [facility] for tasks assessed/performed       Past Medical History:  Diagnosis Date  . Anemia     Past Surgical History:  Procedure Laterality Date  . CESAREAN SECTION     x2    There were no vitals filed for this visit.  Subjective Assessment - 07/03/18 1333    Subjective  On Friday was pushing her daughter on bike and running along with her and had pain in hip which worsened when she stood from a chair later that night. Went away a few hours after. Felt that the pain in the hip would be allieviated if she could just "pop" it because it felt stuck almost.     Pertinent History  nothing remarkable  just 2 C sections    Limitations  Standing;Walking;Other (comment)    How long can you sit comfortably?  unlimited    How long can you stand comfortably?  standing still is fine    How long can you walk comfortably?  < 5 mnintues    Diagnostic tests  x rays normal     Patient Stated Goals  goals return to weight lifting and running    Currently in Pain?  Yes    Pain Score  2     Pain Location  Hip    Pain Orientation  Left    Pain Descriptors / Indicators  Sore    Pain Type  Chronic pain    Pain Radiating Towards  radiates into buttock next to iscial tub, and into groin some, popping anteriorly     Pain Onset  More than a month ago    Pain Frequency  Intermittent    Aggravating Factors   running, any hip flexor for sit ups     Pain Relieving Factors  try to stretch                        [facility] Adult PT Treatment/Exercise - 07/03/18 0001      Lumbar Exercises: Machines for Strengthening   Leg Press  1x10 20# 1x10 40#    Elliptical  5 min level 1      Knee/Hip Exercises: Stretches   Piriformis Stretch  2 reps;20 seconds    Other Knee/Hip Stretches  thomas test stretch 3x30sec      Knee/Hip Exercises: Standing   Hip Abduction  Both;1 set   x10 with red t-band around knees    Hip Extension  Both;1 set;10 reps   x10 with red t-band   Other Standing Knee Exercises  side stepping with red t-band around ankles squat position 2x10    Other Standing Knee Exercises  attempted hip flexion with arms on wall but  immediate pain in left ant groin      Knee/Hip Exercises: Supine   Hip Adduction Isometric  --   hip adduction with bridges 2x10      Knee/Hip Exercises: Sidelying   Clams  2x10    Other Sidelying Knee/Hip Exercises  attempted IR but d/c due to discomfort in hip; sideplaank on knee 2x10    Other Sidelying Knee/Hip Exercises  quadriped fire hydrant x 10 left ; extension in quadruped with knee bent 1x5 without ball squeeze 1x5 with ball squeeze for hamstring activation        Manual Therapy   Manual Therapy  Muscle Energy Technique;Joint mobilization    Joint Mobilization  grade 1 & 2 AP mobs at L hip; LAD L hip    Muscle Energy Technique  3 x 5 sec hold hamstring activation in hooklying             PT Education - 07/03/18 1436    Education Details  HEP; appropriate gym machines avoiding those with excessive hip flexion    Person(s) Educated  Patient    Methods  Explanation;Demonstration;Handout;Verbal cues    Comprehension  Verbalized understanding;Returned demonstration       PT Short Term Goals - 06/13/18 1304      PT SHORT TERM GOAL #1   Title  Pt will be independent with initial  HEP    Time  2    Period  Weeks    Status  New    Target Date  06/27/18      PT SHORT TERM GOAL #2   Title  Pt will be able to perform hip flexor stretch pain free    Baseline  unable to perform without pain and decreased AROM    Time  2    Period  Weeks    Status  New    Target Date  06/27/18        PT Long Term Goals - 07/03/18 1512      PT LONG TERM GOAL #1   Title  Pt will be independent with advanced HEP    Time  6    Period  Weeks    Status  On-going      PT LONG TERM GOAL #2   Title  Pt will be able to perform hip flexor stretch without pain    Baseline  performed thomas test stretch in supine without any pain     Time  6    Period  Weeks    Status  Partially Met      PT LONG TERM GOAL #3   Title  "Pt will improve L hip flexor strength to >/= 4+/5 with </= 2/10 pain to promote safety with walking/standing activities    Time  6    Period  Weeks    Status  On-going      PT LONG TERM GOAL #4   Title  Pt will be able to return to weight lifting activities utiilizing safety and hip joint protections    Time  6    Period  Weeks    Status  On-going      PT LONG TERM GOAL #5   Title  "FOTO will improve from 50% limitation    to  28 % limtation   indicating improved functional mobility .     Time  6    Period  Weeks    Status  On-going  Plan - 07/03/18 1439    Clinical Impression Statement  Patient reports "snapping" pain in anterior hip when passively moving L hip from flexion to extension and groin pain with active hip flexion in standing. Patient with hip flare up over the weekend when running after daughter on bike that didn't last longer than that envening. Patient reports pain relief with grade 1 &2 AP and inf mobs on L hip. Continued to progress hip strengthening exercises without any reports of pain.     Clinical Presentation  Stable    Clinical Decision Making  Low    Rehab Potential  Good    PT Frequency  2x / week    PT Duration  6  weeks    PT Treatment/Interventions  Cryotherapy;Iontophoresis 29m/ml Dexamethasone;Moist Heat;Ultrasound;Therapeutic exercise;Therapeutic activities;Functional mobility training;Neuromuscular re-education;Patient/family education;Manual techniques;Passive range of motion;Dry needling;Taping;Joint Manipulations    PT Next Visit Plan  see how tolerated HEP progression and standing functional strenghting. Grade 1& 2 AP and inf mobs. Continue to progress gym activites on machines if able    PT Home Exercise Plan  HEP without resisistance.  single limb bridge  with post. tilt for pain free left hip flex. clams w/ red band, side plank, fire hydrant, figure 4 IR stretch, adductor squeeze. Hip extension in quadraped with ball squeeze in between knee, thomas test stretch    Consulted and Agree with Plan of Care  Patient       Patient will benefit from skilled therapeutic intervention in order to improve the following deficits and impairments:  Decreased activity tolerance, Decreased mobility, Decreased range of motion, Decreased strength, Impaired flexibility, Pain  Visit Diagnosis: Stiffness of left hip, not elsewhere classified  Muscle weakness (generalized)  Pain in left hip     Problem List Patient Active Problem List   Diagnosis Date Noted  . Hair thinning 03/06/2018  . Pain of left hip joint 03/06/2018   DCarolyne LittlesPT DPT  07/03/2018   KEinar CrowSPT 07/03/2018, 3:15 PM  During this treatment session, the therapist was present, participating in and directing the treatment.   C[place]onG[place] [address]Phone: [phone]  Fax:  [phone] Name: Kiara SoderbergMRN: [id number] of Birth: 6September 03, 1987

## 2018-07-07 ENCOUNTER — Ambulatory Visit: Admitting: Physical Therapy

## 2018-07-07 ENCOUNTER — Ambulatory Visit: Admitting: Family Medicine

## 2018-07-07 ENCOUNTER — Encounter: Payer: Self-pay | Admitting: Physical Therapy

## 2018-07-07 DIAGNOSIS — M6281 Muscle weakness (generalized): Secondary | ICD-10-CM

## 2018-07-07 DIAGNOSIS — M25552 Pain in left hip: Secondary | ICD-10-CM

## 2018-07-07 DIAGNOSIS — M25652 Stiffness of left hip, not elsewhere classified: Secondary | ICD-10-CM

## 2018-07-07 NOTE — Therapy (Signed)
[name], [name], [address] Phone: [phone]   Fax:  [phone]  Physical Therapy Treatment  Patient Details  Name: Kiara Norman MRN: [id number] Date of Birth: 19-Dec-1985 Referring Provider (PT): Chryl Heck, MD   Encounter Date: 07/07/2018  PT End of Session - 07/07/18 1127    Visit Number  4    Number of Visits  13    Date for PT Re-Evaluation  07/25/18    Authorization Type  [employer]  PT only    PT Start Time  1018    PT Stop Time  1101    PT Time Calculation (min)  43 min    Activity Tolerance  Patient tolerated treatment well    Behavior During Therapy  [facility] for tasks assessed/performed       Past Medical History:  Diagnosis Date  . Anemia     Past Surgical History:  Procedure Laterality Date  . CESAREAN SECTION     x2    There were no vitals filed for this visit.  Subjective Assessment - 07/07/18 1032    Subjective  Has some sorenes with HEP but no increase in pain. Feels that quad stretch is helping alot the next day post exercise soreness. Still with popping/snapping when extending leg. Feels that pain is improving overall.     Pertinent History  nothing remarkable  just 2 C sections    Limitations  Standing;Walking;Other (comment)    How long can you sit comfortably?  unlimited    How long can you stand comfortably?  standing still is fine    How long can you walk comfortably?  < 5 mnintues    Diagnostic tests  x rays normal     Patient Stated Goals  goals return to weight lifting and running    Currently in Pain?  Yes    Pain Score  2     Pain Location  Hip    Pain Orientation  Left    Pain Descriptors / Indicators  Sore    Pain Type  Chronic pain    Pain Radiating Towards  radiates into buttock next to iscial tub, and into groin some, popping anteriorly     Pain Onset  More than a month ago    Pain Frequency  Intermittent    Aggravating Factors   running, any  hipflexor for sit ups    Pain Relieving Factors  try to stretch                        [facility] Adult PT Treatment/Exercise - 07/07/18 0001      Lumbar Exercises: Machines for Strengthening   Leg Press  1x10 40# ; 1x10 60#      Knee/Hip Exercises: Stretches   Piriformis Stretch  1 rep;10 seconds   painful in front of hip   Other Knee/Hip Stretches  thomas test stretch 3x30sec ; firgure 4 stretch ; figure four stretch 2x30sec      Knee/Hip Exercises: Aerobic   Elliptical  5 min level 2      Knee/Hip Exercises: Standing   Other Standing Knee Exercises  FWD/BWD Lateral walk outs with 20# 1x10 fwd/bwd 1x8 lateral      Knee/Hip Exercises: Supine   Bridges with Clamshell  2 sets;10 reps    Straight Leg Raises  1 set   d/c after 3 due to pain   Other Supine Knee/Hip Exercises  resisted hip marches in  hooklying blue t-band 2x10       Manual Therapy   Joint Mobilization  Grade 2 & 3 mobs at L hip; discont LAD as increase snapping/popping              PT Education - 07/07/18 1124    Education Details  Reviewed HEP and additional exercises    Person(s) Educated  Patient    Methods  Explanation;Demonstration;Tactile cues;Verbal cues    Comprehension  Verbalized understanding;Returned demonstration;Verbal cues required       PT Short Term Goals - 07/07/18 1141      PT SHORT TERM GOAL #1   Title  Pt will be independent with initial HEP    Time  2    Period  Weeks    Status  On-going      PT SHORT TERM GOAL #2   Title  Pt will be able to perform hip flexor stretch pain free    Baseline  performing daily pain free     Time  2    Period  Weeks    Status  On-going        PT Long Term Goals - 07/07/18 1147      PT LONG TERM GOAL #1   Title  Pt will be independent with advanced HEP    Time  6    Period  Weeks    Status  On-going      PT LONG TERM GOAL #2   Title  Pt will be able to perform hip flexor stretch without pain    Baseline  performed thomas  test stretch in supine without any pain     Time  6    Period  Weeks    Status  Partially Met      PT LONG TERM GOAL #3   Title  "Pt will improve L hip flexor strength to >/= 4+/5 with </= 2/10 pain to promote safety with walking/standing activities    Time  6    Period  Weeks    Status  On-going      PT LONG TERM GOAL #4   Title  Pt will be able to return to weight lifting activities utiilizing safety and hip joint protections    Time  6    Period  Weeks    Status  On-going      PT LONG TERM GOAL #5   Title  "FOTO will improve from 50% limitation    to  28 % limtation   indicating improved functional mobility .     Time  6    Period  Weeks    Status  On-going            Plan - 07/07/18 1127    Clinical Impression Statement  Patient reports improvements in overall pain, but still having popping and snapping with certain movements. Increased snapping sensation with LAD to right hip so discontinued, but still finds relief with grade 3 PA mobs. Progressed hip strengthening with fwd/bwd and  lateral walk outs with 20 pounds.     Clinical Presentation  Stable    Clinical Decision Making  Low    Rehab Potential  Good    PT Frequency  2x / week    PT Duration  6 weeks    PT Treatment/Interventions  Cryotherapy;Iontophoresis 106m/ml Dexamethasone;Moist Heat;Ultrasound;Therapeutic exercise;Therapeutic activities;Functional mobility training;Neuromuscular re-education;Patient/family education;Manual techniques;Passive range of motion;Dry needling;Taping;Joint Manipulations    PT Next Visit Plan  see how tolerated HEP progression  and standing functional strenghting. Grade 3& 4 AP and inf mobs. Continue to progress gym activites on machines if able    PT Home Exercise Plan  HEP without resisistance.  single limb bridge  with post. tilt for pain free left hip flex. clams w/ red band, side plank, fire hydrant, figure 4 IR stretch, adductor squeeze. Hip extension in quadraped with ball squeeze  in between knee, thomas test stretch       Patient will benefit from skilled therapeutic intervention in order to improve the following deficits and impairments:  Decreased activity tolerance, Decreased mobility, Decreased range of motion, Decreased strength, Impaired flexibility, Pain  Visit Diagnosis: Stiffness of left hip, not elsewhere classified  Muscle weakness (generalized)  Pain in left hip     Problem List Patient Active Problem List   Diagnosis Date Noted  . Hair thinning 03/06/2018  . Pain of left hip joint 03/06/2018    Carney Living 07/07/2018, 11:53 AM   Einar Crow SPT  07/07/2018   During this treatment session, the therapist was present, participating in and directing the treatment. [place], [name], [address] Phone: [phone]   Fax:  [phone]  Name: Kiara Norman MRN: [id number] Date of Birth: Apr 24, 1986

## 2018-07-07 NOTE — Therapy (Signed)
[place], [name], [address] Phone: [phone]   Fax:  [phone]  Physical Therapy Treatment  Patient Details  Name: Kiara Norman MRN: [id number] Date of Birth: 1985/11/03 Referring Provider (PT): Chryl Heck, MD   Encounter Date: 07/07/2018  PT End of Session - 07/07/18 1127    Visit Number  4    Number of Visits  13    Date for PT Re-Evaluation  07/25/18    Authorization Type  [employer]  PT only    PT Start Time  1018    PT Stop Time  1101    PT Time Calculation (min)  43 min    Activity Tolerance  Patient tolerated treatment well    Behavior During Therapy  [facility] for tasks assessed/performed       Past Medical History:  Diagnosis Date  . Anemia     Past Surgical History:  Procedure Laterality Date  . CESAREAN SECTION     x2    There were no vitals filed for this visit.  Subjective Assessment - 07/07/18 1032    Subjective  Has some sorenes with HEP but no increase in pain. Feels that quad stretch is helping alot the next day post exercise soreness. Still with popping/snapping when extending leg. Feels that pain is improving overall.     Pertinent History  nothing remarkable  just 2 C sections    Limitations  Standing;Walking;Other (comment)    How long can you sit comfortably?  unlimited    How long can you stand comfortably?  standing still is fine    How long can you walk comfortably?  < 5 mnintues    Diagnostic tests  x rays normal     Patient Stated Goals  goals return to weight lifting and running    Currently in Pain?  Yes    Pain Score  2     Pain Location  Hip    Pain Orientation  Left    Pain Descriptors / Indicators  Sore    Pain Type  Chronic pain    Pain Radiating Towards  radiates into buttock next to iscial tub, and into groin some, popping anteriorly     Pain Onset  More than a month ago    Pain Frequency  Intermittent    Aggravating Factors   running, any  hipflexor for sit ups    Pain Relieving Factors  try to stretch                        [facility] Adult PT Treatment/Exercise - 07/07/18 0001      Lumbar Exercises: Machines for Strengthening   Leg Press  1x10 40# ; 1x10 60#      Knee/Hip Exercises: Stretches   Piriformis Stretch  1 rep;10 seconds   painful in front of hip   Other Knee/Hip Stretches  thomas test stretch 3x30sec ; firgure 4 stretch ; figure four stretch 2x30sec      Knee/Hip Exercises: Aerobic   Elliptical  5 min level 2      Knee/Hip Exercises: Standing   Other Standing Knee Exercises  FWD/BWD Lateral walk outs with 20# 1x10 fwd , 1x10bwd 1x8 lateral R and L      Knee/Hip Exercises: Supine   Bridges with Clamshell  2 sets;10 reps    Straight Leg Raises  1 set   d/c after 3 due to pain   Other Supine Knee/Hip Exercises  resisted hip marches in hooklying blue t-band 2x10       Manual Therapy   Joint Mobilization  Grade 2 & 3 mobs at L hip; discont LAD as increase snapping/popping              PT Education - 07/07/18 1124    Education Details  Reviewed HEP and additional exercises    Person(s) Educated  Patient    Methods  Explanation;Demonstration;Tactile cues;Verbal cues    Comprehension  Verbalized understanding;Returned demonstration;Verbal cues required       PT Short Term Goals - 07/07/18 1141      PT SHORT TERM GOAL #1   Title  Pt will be independent with initial HEP    Time  2    Period  Weeks    Status  On-going      PT SHORT TERM GOAL #2   Title  Pt will be able to perform hip flexor stretch pain free    Baseline  performing daily pain free     Time  2    Period  Weeks    Status  On-going        PT Long Term Goals - 07/07/18 1147      PT LONG TERM GOAL #1   Title  Pt will be independent with advanced HEP    Time  6    Period  Weeks    Status  On-going      PT LONG TERM GOAL #2   Title  Pt will be able to perform hip flexor stretch without pain    Baseline   performed thomas test stretch in supine without any pain     Time  6    Period  Weeks    Status  Partially Met      PT LONG TERM GOAL #3   Title  "Pt will improve L hip flexor strength to >/= 4+/5 with </= 2/10 pain to promote safety with walking/standing activities    Time  6    Period  Weeks    Status  On-going      PT LONG TERM GOAL #4   Title  Pt will be able to return to weight lifting activities utiilizing safety and hip joint protections    Time  6    Period  Weeks    Status  On-going      PT LONG TERM GOAL #5   Title  "FOTO will improve from 50% limitation    to  28 % limtation   indicating improved functional mobility .     Time  6    Period  Weeks    Status  On-going            Plan - 07/07/18 1127    Clinical Impression Statement  Patient reports improvements in overall pain, but still having popping and snapping with certain movements. Increased snapping sensation with LAD to right hip so discontinued, but still finds relief with grade 3 PA mobs. Progressed hip strengthening with fwd/bwd and  lateral walk outs with 20 pounds with no reports of pain.     Clinical Presentation  Stable    Clinical Decision Making  Low    Rehab Potential  Good    PT Frequency  2x / week    PT Duration  6 weeks    PT Treatment/Interventions  Cryotherapy;Iontophoresis 34m/ml Dexamethasone;Moist Heat;Ultrasound;Therapeutic exercise;Therapeutic activities;Functional mobility training;Neuromuscular re-education;Patient/family education;Manual techniques;Passive range of motion;Dry needling;Taping;Joint Manipulations    PT  Next Visit Plan  see how tolerated HEP progression and standing functional strenghting. Grade 3& 4 AP and inf mobs. Continue to progress gym activites on machines if able    PT Home Exercise Plan  HEP without resisistance.  single limb bridge  with post. tilt for pain free left hip flex. clams w/ red band, side plank, fire hydrant, figure 4 IR stretch, adductor squeeze. Hip  extension in quadraped with ball squeeze in between knee, thomas test stretch       Patient will benefit from skilled therapeutic intervention in order to improve the following deficits and impairments:  Decreased activity tolerance, Decreased mobility, Decreased range of motion, Decreased strength, Impaired flexibility, Pain  Visit Diagnosis: Stiffness of left hip, not elsewhere classified  Muscle weakness (generalized)  Pain in left hip     Problem List Patient Active Problem List   Diagnosis Date Noted  . Hair thinning 03/06/2018  . Pain of left hip joint 03/06/2018    Einar Crow  SPT 07/07/2018, 11:51 AM  [facility] [address] [place], [name], [address] Phone: [phone]   Fax:  [phone]  Name: Rayleen Wyrick MRN: [id number] Date of Birth: September 28, 1985

## 2018-07-10 ENCOUNTER — Encounter: Payer: Self-pay | Admitting: Physical Therapy

## 2018-07-10 ENCOUNTER — Ambulatory Visit: Admitting: Physical Therapy

## 2018-07-10 DIAGNOSIS — M25652 Stiffness of left hip, not elsewhere classified: Secondary | ICD-10-CM

## 2018-07-10 DIAGNOSIS — M6281 Muscle weakness (generalized): Secondary | ICD-10-CM

## 2018-07-10 DIAGNOSIS — M25552 Pain in left hip: Secondary | ICD-10-CM

## 2018-07-10 NOTE — Therapy (Addendum)
[place], [name], [address] Phone: [phone]   Fax:  [phone]  Physical Therapy Treatment  Patient Details  Name: Kiara Norman MRN: [id number] Date of Birth: 1986/03/30 Referring Provider (PT): Chryl Heck, MD   Encounter Date: 07/10/2018  PT End of Session - 07/10/18 1633    Visit Number  5    Number of Visits  13    Date for PT Re-Evaluation  07/25/18    Authorization Type  [employer]  PT only    PT Start Time  1330    PT Stop Time  1425    PT Time Calculation (min)  55 min    Activity Tolerance  Patient tolerated treatment well    Behavior During Therapy  [facility] for tasks assessed/performed       Past Medical History:  Diagnosis Date  . Anemia     Past Surgical History:  Procedure Laterality Date  . CESAREAN SECTION     x2    There were no vitals filed for this visit.  Subjective Assessment - 07/10/18 1335    Subjective  Patient reports increased soreness and pain secondary to last session. Started Friday afternoon deep in groin area. Over the weekend continued to radiate around posteriorly into buttock region. Also intermittently walked 2 miles [place]urday at church which may have also increased pain and symptoms.     Pertinent History  nothing remarkable  just 2 C sections    Limitations  Standing;Walking;Other (comment)    How long can you sit comfortably?  unlimited    How long can you stand comfortably?  standing still is fine    How long can you walk comfortably?  < 5 mnintues    Diagnostic tests  x rays normal     Patient Stated Goals  goals return to weight lifting and running    Currently in Pain?  Yes    Pain Score  6     Pain Location  Hip    Pain Orientation  Left    Pain Descriptors / Indicators  Sore;Aching    Pain Type  Chronic pain    Pain Radiating Towards  radiating into buttock next to iscial tub, and groin, popping anteriorly with certain movements     Pain  Onset  More than a month ago    Pain Frequency  Intermittent    Aggravating Factors   running, any hip flexor for sit ups    Pain Relieving Factors  try to stretch                       [facility] Adult PT Treatment/Exercise - 07/10/18 0001      Lumbar Exercises: Aerobic   Nustep  5 min L5      Knee/Hip Exercises: Stretches   Other Knee/Hip Stretches  thomas test stretch with IASTM x3 ; figure 4 stretch Bil 3x30sec; adductor butterfly stretch 3x30sec      Knee/Hip Exercises: Supine   Other Supine Knee/Hip Exercises  Bridge 2x10 ; clamshells with green t-band 2x10; SLR increased pain x1 ;      Cryotherapy   Cryotherapy Location  Other (comment)   post buttock, ant groin   Type of Cryotherapy  Ice pack      Manual Therapy   Manual therapy comments  IASTM to proximal lateal quads, TFL, adductors ; soft tissue mobilization to proximal quad and TFL    Joint Mobilization  Grade 2 &  3 PA and lateral mobs at L hip;             PT Education - 07/10/18 1638    Education Details  Light hip stretching and stretching; ice for symptom management     Person(s) Educated  Patient    Methods  Explanation;Demonstration;Tactile cues;Verbal cues    Comprehension  Verbalized understanding;Returned demonstration;Verbal cues required       PT Short Term Goals - 07/10/18 1442      PT SHORT TERM GOAL #1   Title  Pt will be independent with initial HEP    Time  2    Period  Weeks    Status  On-going      PT SHORT TERM GOAL #2   Title  Pt will be able to perform hip flexor stretch pain free    Baseline  performing daily pain free     Time  2    Period  Weeks    Status  On-going        PT Long Term Goals - 07/10/18 1443      PT LONG TERM GOAL #1   Title  Pt will be independent with advanced HEP    Time  6    Period  Weeks    Status  On-going      PT LONG TERM GOAL #2   Title  Pt will be able to perform hip flexor stretch without pain    Baseline  performed thomas  test stretch in supine without any pain     Time  6    Period  Weeks    Status  Partially Met      PT LONG TERM GOAL #3   Title  "Pt will improve L hip flexor strength to >/= 4+/5 with </= 2/10 pain to promote safety with walking/standing activities    Time  6    Period  Weeks    Status  On-going      PT LONG TERM GOAL #4   Title  Pt will be able to return to weight lifting activities utiilizing safety and hip joint protections    Time  6    Period  Weeks    Status  On-going      PT LONG TERM GOAL #5   Title  "FOTO will improve from 50% limitation    to  28 % limtation   indicating improved functional mobility .     Time  6    Period  Weeks    Status  On-going            Plan - 07/10/18 1404    Clinical Impression Statement  Patient reported increased pain and radicular symptoms deep to groin that radiated posteriorly into buttock after increasing strengthening exercises last therapy session. Therapy focused on light stretching and strengthening to decrease pain and tightness. Pt reported anterior groin pain relief with PA mob to left hip and pain relief to posterior buttock with lateral distraction, that only lasted briefly. Placed ice on anterior groin and posterior buttock at end of session with pt reporting it decreased pain at the end.     Clinical Presentation  Stable    Clinical Decision Making  Low    Rehab Potential  Good    PT Frequency  2x / week    PT Duration  6 weeks    PT Treatment/Interventions  Cryotherapy;Iontophoresis 35m/ml Dexamethasone;Moist Heat;Ultrasound;Therapeutic exercise;Therapeutic activities;Functional mobility training;Neuromuscular re-education;Patient/family education;Manual techniques;Passive range of motion;Dry  needling;Taping;Joint Manipulations    PT Next Visit Plan  see how tolerated HEP progression and standing functional strenghting. Grade 3& 4 AP and lateral mobs. Continue to progress gym activites on machines if able    PT Home  Exercise Plan  HEP without resisistance.  single limb bridge  with post. tilt for pain free left hip flex. clams w/ red band, side plank, fire hydrant, figure 4 IR stretch, adductor squeeze. Hip extension in quadraped with ball squeeze in between knee, thomas test stretch    Consulted and Agree with Plan of Care  Patient       Patient will benefit from skilled therapeutic intervention in order to improve the following deficits and impairments:  Decreased activity tolerance, Decreased mobility, Decreased range of motion, Decreased strength, Impaired flexibility, Pain  Visit Diagnosis: Stiffness of left hip, not elsewhere classified  Muscle weakness (generalized)  Pain in left hip     Problem List Patient Active Problem List   Diagnosis Date Noted  . Hair thinning 03/06/2018  . Pain of left hip joint 03/06/2018   Carolyne Littles PT DPT  07/10/2018   Einar Crow SPT 07/10/2018, 4:39 PM   During this treatment session, the therapist was present, participating in and directing the treatment.    [place], [name], [address] Phone: [phone]   Fax:  [phone]  Name: Kiara Norman MRN: [id number] Date of Birth: [birth year]

## 2018-07-14 ENCOUNTER — Ambulatory Visit: Admitting: Physical Therapy

## 2018-07-17 ENCOUNTER — Encounter: Payer: Self-pay | Admitting: Physical Therapy

## 2018-07-17 ENCOUNTER — Ambulatory Visit: Admitting: Physical Therapy

## 2018-07-17 DIAGNOSIS — M25552 Pain in left hip: Secondary | ICD-10-CM

## 2018-07-17 DIAGNOSIS — M6281 Muscle weakness (generalized): Secondary | ICD-10-CM

## 2018-07-17 DIAGNOSIS — M25652 Stiffness of left hip, not elsewhere classified: Secondary | ICD-10-CM

## 2018-07-17 NOTE — Therapy (Addendum)
[place], [name], [address] Phone: [phone]   Fax:  [phone]  Physical Therapy Treatment  Patient Details  Name: Kiara Norman MRN: [id number] Date of Birth: 07-11-1986 Referring Provider (PT): Chryl Heck, MD   Encounter Date: 07/17/2018  PT End of Session - 07/17/18 1603    Visit Number  6    Number of Visits  13    Date for PT Re-Evaluation  07/25/18    Authorization Type  [employer]IC[employer]  PT only    PT Start Time  1330    PT Stop Time  1415    PT Time Calculation (min)  45 min    Activity Tolerance  Patient tolerated treatment well    Behavior During Therapy  [facility] for tasks assessed/performed       Past Medical History:  Diagnosis Date  . Anemia     Past Surgical History:  Procedure Laterality Date  . CESAREAN SECTION     x2    There were no vitals filed for this visit.  Subjective Assessment - 07/17/18 1338    Subjective  Patient reports pain had improved hip with light exercises but accidently caught foot wrong on steps yestiurday and fell down 4 stairs. Believe the way she fell may have increased pain in hip slighly but most pain in wrist where she tried to catch her self.  Radiating ischial pain is gone.     Pertinent History  nothing remarkable  just 2 C sections    Limitations  Standing;Walking;Other (comment)    How long can you sit comfortably?  unlimited    How long can you stand comfortably?  standing still is fine    How long can you walk comfortably?  < 5 mnintues    Diagnostic tests  x rays normal     Patient Stated Goals  goals return to weight lifting and running    Currently in Pain?  Yes    Pain Score  3     Pain Location  Hip    Pain Orientation  Left    Pain Descriptors / Indicators  Sore;Aching    Pain Type  Chronic pain    Pain Radiating Towards  poppin anteriorly when moving from flexion into extension; groin and radiating ischial tub pain occasionally     Pain Onset  More than a month ago    Pain Frequency  Intermittent    Aggravating Factors   running, any hip flexor for sit upts     Pain Relieving Factors  try to stretch                        [facility] Adult PT Treatment/Exercise - 07/17/18 0001      Lumbar Exercises: Machines for Strengthening   Leg Press  1x20# x10; 1x8 40#      Knee/Hip Exercises: Stretches   Other Knee/Hip Stretches  thomas teset stretch with arms overhead 3x30 sesc; firgure 4 stretch 4x30sec       Knee/Hip Exercises: Aerobic   Elliptical  5 min level 3      Knee/Hip Exercises: Standing   Other Standing Knee Exercises  standing SLS ball toss against rebounder 1x10 on level surface; x10 on green foam pad       Knee/Hip Exercises: Supine   Other Supine Knee/Hip Exercises  Bridge x10; bridge with ball squeeze x10 ; ball squeeze x10;  SLR x10 to peak of opposite knee; hip  flexion x10 wit hgreen t-band; SAQ with 2# weights over bolster 2x10      Knee/Hip Exercises: Sidelying   Clams  2x10   green t-band   Other Sidelying Knee/Hip Exercises  IR with pink ball between knees x10       Iontophoresis   Type of Iontophoresis  Dexamethasone    Location  Proximal quads    Dose  1CC    Time  slow release             PT Education - 07/17/18 1602    Education Details  Progress back into strengthening program if pain stays low; Ionto benefits     Person(s) Educated  Patient    Methods  Explanation;Demonstration;Tactile cues;Verbal cues    Comprehension  Verbalized understanding;Returned demonstration;Verbal cues required       PT Short Term Goals - 07/17/18 1610      PT SHORT TERM GOAL #1   Title  Pt will be independent with initial HEP    Time  2    Period  Weeks    Status  On-going      PT SHORT TERM GOAL #2   Title  Pt will be able to perform hip flexor stretch pain free    Baseline  performing daily pain free     Period  Weeks    Status  Achieved        PT Long Term Goals -  07/17/18 1610      PT LONG TERM GOAL #1   Title  Pt will be independent with advanced HEP    Time  6    Period  Weeks    Status  On-going      PT LONG TERM GOAL #2   Title  Pt will be able to perform hip flexor stretch without pain    Baseline  performed thomas test stretch in supine without any pain     Time  6    Period  Weeks    Status  Partially Met      PT LONG TERM GOAL #3   Title  "Pt will improve L hip flexor strength to >/= 4+/5 with </= 2/10 pain to promote safety with walking/standing activities    Baseline  <3/10 pain     Time  6    Period  Weeks    Status  On-going      PT LONG TERM GOAL #4   Title  Pt will be able to return to weight lifting activities utiilizing safety and hip joint protections    Time  6    Period  Weeks    Status  On-going      PT LONG TERM GOAL #5   Title  "FOTO will improve from 50% limitation    to  28 % limtation   indicating improved functional mobility .     Time  6    Period  Weeks    Status  On-going            Plan - 07/17/18 1603    Clinical Impression Statement  Patient repors radicular pain to iscial tub and groin has ceased, and overall hip pain stayed down with light strenghtening and stretching until fell down steps. Reports she fell down steps due to pure clumsiness and not due to hip weakenss. Pt with overall brusied distal wrist flexors, but able to actively move wrist.Encouraged pt to contact MD if pain persists in wrist. Progressed pt back into  hip and quad strengthening exercises with low weights and higher reps. No reports of pain with any exercises today. Trialed iontophoresis patch at end of session over proximal rectus femoris and vastis lateralis to decrease inflammation and pain.     Clinical Presentation  Stable    Clinical Decision Making  Low    Rehab Potential  Good    PT Frequency  2x / week    PT Duration  6 weeks    PT Treatment/Interventions  Cryotherapy;Iontophoresis 24m/ml Dexamethasone;Moist  Heat;Ultrasound;Therapeutic exercise;Therapeutic activities;Functional mobility training;Neuromuscular re-education;Patient/family education;Manual techniques;Passive range of motion;Dry needling;Taping;Joint Manipulations    PT Next Visit Plan  assess how tolerated HEP progression and ionto patch. Grade 3& 4 AP and lateral mobs. Continue to progress gym activites on machines if able    PT Home Exercise Plan  HEP without resisistance.  single limb bridge  with post. tilt for pain free left hip flex. clams w/ red band, side plank, fire hydrant, figure 4 IR stretch, adductor squeeze. Hip extension in quadraped with ball squeeze in between knee, thomas test stretch    Consulted and Agree with Plan of Care  Patient       Patient will benefit from skilled therapeutic intervention in order to improve the following deficits and impairments:  Decreased activity tolerance, Decreased mobility, Decreased range of motion, Decreased strength, Impaired flexibility, Pain  Visit Diagnosis: Stiffness of left hip, not elsewhere classified  Muscle weakness (generalized)  Pain in left hip     Problem List Patient Active Problem List   Diagnosis Date Noted  . Hair thinning 03/06/2018  . Pain of left hip joint 03/06/2018   DCarolyne LittlesPT DPT  07/17/2018   KEinar CrowSPT 07/17/2018, 5:36 PM  During this treatment session, the therapist was present, participating in and directing the treatment.  [place] [address]Phone: [phone]  Fax:  [phone] Name: Kiara TodaroMRN: [id number] of Birth: 6November 06, 1987

## 2018-07-20 ENCOUNTER — Encounter: Payer: Self-pay | Admitting: Physical Therapy

## 2018-07-20 ENCOUNTER — Ambulatory Visit: Admitting: Physical Therapy

## 2018-07-20 DIAGNOSIS — M25652 Stiffness of left hip, not elsewhere classified: Secondary | ICD-10-CM | POA: Diagnosis not present

## 2018-07-20 DIAGNOSIS — M25552 Pain in left hip: Secondary | ICD-10-CM

## 2018-07-20 DIAGNOSIS — M6281 Muscle weakness (generalized): Secondary | ICD-10-CM

## 2018-07-20 NOTE — Therapy (Addendum)
[place], [name], [address] Phone: [phone]   Fax:  [phone]  Physical Therapy Treatment  Patient Details  Name: Kiara Norman MRN: [id number] Date of Birth: 11-Feb-1986 Referring Provider (PT): Chryl Heck, MD   Encounter Date: 07/20/2018  PT End of Session - 07/20/18 1720    Visit Number  7    Number of Visits  13    Date for PT Re-Evaluation  07/25/18    Authorization Type  TRIC[employer]  PT only    PT Start Time  1330    PT Stop Time  1415    PT Time Calculation (min)  45 min    Activity Tolerance  Patient tolerated treatment well    Behavior During Therapy  [facility] for tasks assessed/performed       Past Medical History:  Diagnosis Date  . Anemia     Past Surgical History:  Procedure Laterality Date  . CESAREAN SECTION     x2    There were no vitals filed for this visit.  Subjective Assessment - 07/20/18 1342    Subjective  Pt reports she has not had much hip pain ove rthe past few days. Seen MD about wrist from fall who denied fracture but posible injury to soft tissue. Wearing wrist brace. Driving to [place] for the weekend for monthly drill but believes that she will not have to do much because of hip.    Pertinent History  nothing remarkable  just 2 C sections    Limitations  Standing;Walking;Other (comment)    How long can you sit comfortably?  unlimited    How long can you stand comfortably?  standing still is fine    How long can you walk comfortably?  < 5 mnintues    Diagnostic tests  x rays normal     Patient Stated Goals  goals return to weight lifting and running    Currently in Pain?  No/denies    Pain Score  0-No pain    Pain Orientation  Left    Pain Radiating Towards  popping anteriorly when moving from flexion into extension; groin and radiating iscial tub pain occasionally     Pain Onset  More than a month ago    Pain Frequency  Intermittent    Aggravating Factors    running , any hip flexor for sit ups    Pain Relieving Factors  try to stretch                       [facility] Adult PT Treatment/Exercise - 07/20/18 0001      Knee/Hip Exercises: Aerobic   Elliptical  5 min L3      Knee/Hip Exercises: Supine   Straight Leg Raises  10 reps;2 sets    Other Supine Knee/Hip Exercises  Bridge 2x10 with green tband around leg; clamshells x20; bent knee raises x10 Bil      Iontophoresis   Type of Iontophoresis  Dexamethasone    Location  Proximal quads    Dose  1CC    Time  slow release      Manual Therapy   Manual Therapy  Joint mobilization;Soft tissue mobilization    Manual therapy comments  tiger tail roll to piriformis, glues lateral quads and hamstring; skilled palpation of trigger points    Joint Mobilization  Grade 3& 4 AP hip mobs; lateral hip glide     Soft tissue mobilization  glutes and piriformis  PT Education - 07/20/18 1719    Education Details  Benefits of dry needling; stretches for post needle soreness ; POC     Person(s) Educated  Patient    Methods  Explanation;Demonstration;Verbal cues    Comprehension  Verbalized understanding;Returned demonstration       PT Short Term Goals - 07/17/18 1610      PT SHORT TERM GOAL #1   Title  Pt will be independent with initial HEP    Time  2    Period  Weeks    Status  On-going      PT SHORT TERM GOAL #2   Title  Pt will be able to perform hip flexor stretch pain free    Baseline  performing daily pain free     Period  Weeks    Status  Achieved        PT Long Term Goals - 07/17/18 1610      PT LONG TERM GOAL #1   Title  Pt will be independent with advanced HEP    Time  6    Period  Weeks    Status  On-going      PT LONG TERM GOAL #2   Title  Pt will be able to perform hip flexor stretch without pain    Baseline  performed thomas test stretch in supine without any pain     Time  6    Period  Weeks    Status  Partially Met      PT LONG  TERM GOAL #3   Title  "Pt will improve L hip flexor strength to >/= 4+/5 with </= 2/10 pain to promote safety with walking/standing activities    Baseline  <3/10 pain     Time  6    Period  Weeks    Status  On-going      PT LONG TERM GOAL #4   Title  Pt will be able to return to weight lifting activities utiilizing safety and hip joint protections    Time  6    Period  Weeks    Status  On-going      PT LONG TERM GOAL #5   Title  "FOTO will improve from 50% limitation    to  28 % limtation   indicating improved functional mobility .     Time  6    Period  Weeks    Status  On-going            Plan - 07/20/18 1720    Clinical Impression Statement  Pt reports she has not had any hip pain this week and some soreness following previous therapy session. Dry needling performed on L piriformis with good twitch repsone ilicited. Instructed pt in stretches for post needle soreness and strengthening exercises. Pt beleived iontophoresis patch decreased pain after last session and requested to do again at end of session in proximal lateral quads.      Clinical Presentation  Stable    Clinical Decision Making  Low    Rehab Potential  Good    PT Frequency  2x / week    PT Duration  6 weeks    PT Treatment/Interventions  Cryotherapy;Iontophoresis 37m/ml Dexamethasone;Moist Heat;Ultrasound;Therapeutic exercise;Therapeutic activities;Functional mobility training;Neuromuscular re-education;Patient/family education;Manual techniques;Passive range of motion;Dry needling;Taping;Joint Manipulations    PT Next Visit Plan  assess response to dry needling; progress into pre running drills; possibly treadmill     PT Home Exercise Plan  HEP without resisistance.  single  limb bridge  with post. tilt for pain free left hip flex. clams w/ red band, side plank, fire hydrant, figure 4 IR stretch, adductor squeeze. Hip extension in quadraped with ball squeeze in between knee, thomas test stretch    Consulted and  Agree with Plan of Care  Patient       Patient will benefit from skilled therapeutic intervention in order to improve the following deficits and impairments:  Decreased activity tolerance, Decreased mobility, Decreased range of motion, Decreased strength, Impaired flexibility, Pain  Visit Diagnosis: No diagnosis found.     Problem List Patient Active Problem List   Diagnosis Date Noted  . Hair thinning 03/06/2018  . Pain of left hip joint 03/06/2018   Carolyne Littles PT DPT  07/20/2018   Einar Crow SPT 07/20/2018, 5:33 PM   During this treatment session, the therapist was present, participating in and directing the treatment.  [place], [name], [address] Phone: [phone]   Fax:  [phone]  Name: Kiara Norman MRN: [id number] Date of Birth: 06-23-1986

## 2018-08-01 ENCOUNTER — Ambulatory Visit: Attending: Family Medicine | Admitting: Physical Therapy

## 2018-08-01 DIAGNOSIS — M6281 Muscle weakness (generalized): Secondary | ICD-10-CM | POA: Diagnosis present

## 2018-08-01 DIAGNOSIS — M25652 Stiffness of left hip, not elsewhere classified: Secondary | ICD-10-CM | POA: Insufficient documentation

## 2018-08-01 DIAGNOSIS — M25552 Pain in left hip: Secondary | ICD-10-CM | POA: Insufficient documentation

## 2018-08-01 NOTE — Therapy (Signed)
[place], [name], [address] Phone: [phone]   Fax:  [phone]  Physical Therapy Treatment/Recertification   Patient Details  Name: Kiara Norman MRN: [id number] Date of Birth: 1986/02/27 Referring Provider (PT): Karlton Lemon, MD   Encounter Date: 08/01/2018  PT End of Session - 08/01/18 1155    Visit Number  8    Number of Visits  20    Date for PT Re-Evaluation  09/12/18    Authorization Type  TRICARE  PT only    PT Start Time  1145    PT Stop Time  1230    PT Time Calculation (min)  45 min    Activity Tolerance  Patient tolerated treatment well    Behavior During Therapy  [facility] for tasks assessed/performed       Past Medical History:  Diagnosis Date  . Anemia     Past Surgical History:  Procedure Laterality Date  . CESAREAN SECTION     x2    There were no vitals filed for this visit.  Subjective Assessment - 08/01/18 1154    Subjective  I went to [employer] and I am having pain since activity    Pertinent History  nothing remarkable  just 2 C sections    Limitations  Standing;Walking;Other (comment)    How long can you sit comfortably?  unlimited    How long can you stand comfortably?  standing still is fine    How long can you walk comfortably?  < than 5-7 minutes before getting painful    Diagnostic tests  x rays normal     Patient Stated Goals  goals return to weight lifting and running    Pain Score  5     Pain Location  Hip    Pain Orientation  Left    Pain Descriptors / Indicators  Sore;Aching    Pain Type  Chronic pain    Pain Radiating Towards  popping anteriorly when moving from flexion into extension, groin and radiating ischial tuberosity pain occasionally    Pain Frequency  Intermittent    Aggravating Factors   running, any hip flexors for sit up or [employer] drills         [facility] PT Assessment - 08/01/18 1157      Assessment    Medical Diagnosis  left hip pain    Referring Provider (PT)  Chryl Heck, MD    Onset Date/Surgical Date  15/72/62   popping and clicking for a year   Hand Dominance  Right    Next MD Visit  need to schedule    Prior Therapy  none      Observation/Other Assessments   Focus on Therapeutic Outcomes (FOTO)   FOTO intake 50% limtation 50% predicted 28%   no improvement from eval     Functional Tests   Functional tests  Single leg stance;Running;Squat;Lunges      Squat   Comments  Pt able to squat 90 degrees hip flexion but mentions tightness on left hip      Lunges   Comments  able to lunge with ther ex pad to groung , pt can ascend on left but returning to standing pain in left groin      Running   Comments  unable to run due to left hip pain      Single Leg Stance   Comments  right 30 sec no pain, left 30 sec minimal aching  from eval      ROM / Strength   AROM / PROM / Strength  AROM;Strength      AROM   Right Hip Extension  12    Right Hip Flexion  130    Right Hip External Rotation   35    Right Hip Internal Rotation   45    Right Hip ABduction  50    Right Hip ADduction  35    Left Hip Extension  14    Left Hip Flexion  128    Left Hip External Rotation   35    Left Hip Internal Rotation   25    Left Hip ABduction  40   pain in groin   Left Hip ADduction  35   no pain in groin     Strength   Right Hip Flexion  5/5    Right Hip Extension  5/5    Right Hip External Rotation   5/5    Right Hip Internal Rotation  5/5    Right Hip ABduction  5/5    Left Hip Flexion  4+/5   Pain in anterior hip   Left Hip Extension  4/5   stretching pain  in left hip flexor    Left Hip External Rotation  4/5    Left Hip Internal Rotation  4/5    Left Hip ABduction  4/5    Right Knee Flexion  5/5    Right Knee Extension  5/5    Left Knee Flexion  5/5    Left Knee Extension  5/5      Palpation   Palpation comment  Pt with ER popping decreased since eval but exacerbates when  returning form left hip flexion to extension                   [facility] Adult PT Treatment/Exercise - 08/01/18 0001      Knee/Hip Exercises: Stretches   Other Knee/Hip Stretches  thomas test stretch with arms overhead 3x30 sesc; firgure 4 stretch 4x30sec       Knee/Hip Exercises: Standing   Other Standing Knee Exercises  lunges on Ther ex pad x 10 right and left   pain on ant left hip when returning to standing     Knee/Hip Exercises: Supine   Straight Leg Raises  10 reps;2 sets    Other Supine Knee/Hip Exercises  Bridge 2x10 with green tband around leg; clamshells x20;       Knee/Hip Exercises: Sidelying   Other Sidelying Knee/Hip Exercises  IR with pink ball between knees x10       Manual Therapy   Manual Therapy  Joint mobilization;Soft tissue mobilization    Manual therapy comments  skilled PT for TPDN    Joint Mobilization  Grade 3& 4 AP hip mobs; lateral hip glide     Soft tissue mobilization  left quad/ TFL        Trigger Point Dry Needling - 08/01/18 1201    Consent Given?  Yes    Education Handout Provided  Yes    Muscles Treated Lower Body  Quadriceps;Tensor fascia lata   left side only   Tensor Fascia Lata Response  Palpable increased muscle length    Quadriceps Response  Twitch response elicited;Palpable increased muscle length           PT Education - 08/01/18 1242    Education Details  given handout for TPDN and precautians and aftercare  Person(s) Educated  Patient    Methods  Explanation;Demonstration    Comprehension  Verbalized understanding;Returned demonstration       PT Short Term Goals - 08/01/18 1151      PT SHORT TERM GOAL #1   Title  Pt will be independent with initial HEP    Time  2    Period  Weeks    Status  Achieved      PT SHORT TERM GOAL #2   Title  Pt will be able to perform hip flexor stretch pain free    Baseline  performing daily pain free     Time  2    Period  Weeks    Status  Achieved        PT Long  Term Goals - 08/01/18 1151      PT LONG TERM GOAL #1   Title  Pt will be independent with advanced HEP    Time  6    Period  Weeks    Target Date  09/12/18      PT LONG TERM GOAL #2   Title  Pt will be able to perform hip flexor stretch without pain    Baseline  performed thomas test stretch in supine without any pain  but unable to do lunge without pain    Time  6    Period  Weeks    Status  Partially Met    Target Date  09/12/18      PT LONG TERM GOAL #3   Title  "Pt will improve L hip flexor strength to >/= 4+/5 with </= 2/10 pain to promote safety with walking/standing activities    Baseline  pain in4/10  MMT LE at least 4/5 in all and higher improved from eval but still working on goals    Time  6    Period  Weeks    Status  On-going      PT LONG TERM GOAL #4   Title  Pt will be able to return to weight lifting activities utiilizing safety and hip joint protections    Baseline  Pt unable to lift weights or return to [employer] duties and physical activity due to left anterior hip pain     Time  6    Period  Weeks    Status  On-going      PT LONG TERM GOAL #5   Title  "FOTO will improve from 50% limitation    to  28 % limtation   indicating improved functional mobility .     Baseline  08-02-18 50%limitation   No change from evalauation    Time  6    Period  Weeks    Status  On-going    Target Date  09/12/18            Plan - 08/01/18 1155    Clinical Impression Statement  Ms Stoffers returns for re eval. Pt reports 5/10 pain today and reduces to 4/10 but she still has ongoing soreness in anterior hip after actiivty, elliptical and especially after her [employer] duties.  Pt is still unable to run or return to normal fitness activities.  Pt has increased Hip strength to at least 4/5 in left hip but would benefit from continued strengthening.  Pt is going to recheck with Dr Barbaraann Barthel due to ongoing anterior hip pain.  Pt repsonded well to TPDN last time at  piriformis and consented to TPDN this time to anterior quad/ TFL.  Pt was closely monitored and pain in anterior hip was 4/10 post RX.  Pt will benefit from continued skilled PT for strengthening. and return to her activie lifestyle.  Pt will return to dr Barbaraann Barthel for examination of soft tissue issues.     Clinical Presentation  Stable    Clinical Decision Making  Low    Rehab Potential  Good    PT Frequency  2x / week    PT Duration  6 weeks    PT Treatment/Interventions  Cryotherapy;Iontophoresis 12m/ml Dexamethasone;Moist Heat;Ultrasound;Therapeutic exercise;Therapeutic activities;Functional mobility training;Neuromuscular re-education;Patient/family education;Manual techniques;Passive range of motion;Dry needling;Taping;Joint Manipulations    PT Next Visit Plan   progress into pre running drills; possibly treadmill  RE eval last 08-02-18    PT Home Exercise Plan  HEP without resisistance.  single limb bridge  with post. tilt for pain free left hip flex. clams w/ red band, side plank, fire hydrant, figure 4 IR stretch, adductor squeeze. Hip extension in quadraped with ball squeeze in between knee, thomas test stretch    Consulted and Agree with Plan of Care  Patient       Patient will benefit from skilled therapeutic intervention in order to improve the following deficits and impairments:  Decreased activity tolerance, Decreased mobility, Decreased range of motion, Decreased strength, Impaired flexibility, Pain  Visit Diagnosis: Stiffness of left hip, not elsewhere classified  Muscle weakness (generalized)  Pain in left hip     Problem List Patient Active Problem List   Diagnosis Date Noted  . Hair thinning 03/06/2018  . Pain of left hip joint 03/06/2018  LVoncille Lo PT Certified Exercise Expert for the Aging Adult  08/01/18 12:45 PM Phone: [phone]: 3[facility]  [address]Phone: [phone]  Fax:  [phone] Name: AShemaiah RoundMRN: [id number] of Birth: [birth year]

## 2018-08-01 NOTE — Patient Instructions (Signed)
Trigger Point Dry Needling  . What is Trigger Point Dry Needling (DN)? o DN is a physical therapy technique used to treat muscle pain and dysfunction. Specifically, DN helps deactivate muscle trigger points (muscle knots).  o A thin filiform needle is used to penetrate the skin and stimulate the underlying trigger point. The goal is for a local twitch response (LTR) to occur and for the trigger point to relax. No medication of any kind is injected during the procedure.   . What Does Trigger Point Dry Needling Feel Like?  o The procedure feels different for each individual patient. Some patients report that they do not actually feel the needle enter the skin and overall the process is not painful. Very mild bleeding may occur. However, many patients feel a deep cramping in the muscle in which the needle was inserted. This is the local twitch response.   Marland Kitchen. How Will I feel after the treatment? o Soreness is normal, and the onset of soreness may not occur for a few hours. Typically this soreness does not last longer than two days.  o Bruising is uncommon, however; ice can be used to decrease any possible bruising.  o In rare cases feeling tired or nauseous after the treatment is normal. In addition, your symptoms may get worse before they get better, this period will typically not last longer than 24 hours.   . What Can I do After My Treatment? o Increase your hydration by drinking more water for the next 24 hours. o You may place ice or heat on the areas treated that have become sore, however, do not use heat on inflamed or bruised areas. Heat often brings more relief post needling. o You can continue your regular activities, but vigorous activity is not recommended initially after the treatment for 24 hours. o DN is best combined with other physical therapy such as strengthening, stretching, and other therapies.    Garen LahLawrie Beardsley, PT Certified Exercise Expert for the Aging Adult  08/01/18  12:05 PM Phone: [phone] Fax: [phone]

## 2018-08-08 ENCOUNTER — Ambulatory Visit: Admitting: Physical Therapy

## 2018-08-08 ENCOUNTER — Encounter: Payer: Self-pay | Admitting: Physical Therapy

## 2018-08-08 DIAGNOSIS — M25652 Stiffness of left hip, not elsewhere classified: Secondary | ICD-10-CM

## 2018-08-08 DIAGNOSIS — M25552 Pain in left hip: Secondary | ICD-10-CM

## 2018-08-08 DIAGNOSIS — M6281 Muscle weakness (generalized): Secondary | ICD-10-CM

## 2018-08-08 NOTE — Patient Instructions (Signed)
       Garen LahLawrie Beardsley, PT Certified Exercise Expert for the Aging Adult  08/08/18 3:34 PM Phone: [phone] Fax: [phone]

## 2018-08-08 NOTE — Therapy (Signed)
[place], [name], [address] Phone: [phone]   Fax:  [phone]  Physical Therapy Treatment  Patient Details  Name: Kiara Norman MRN: [id number] Date of Birth: 1986-01-01 Referring Provider (PT): Chryl Heck, MD   Encounter Date: 08/08/2018  PT End of Session - 08/08/18 1500    Visit Number  9    Number of Visits  20    Date for PT Re-Evaluation  09/12/18    Authorization Type  [employer]  PT only    PT Start Time  1500    PT Stop Time  1544    PT Time Calculation (min)  44 min    Activity Tolerance  Patient tolerated treatment well    Behavior During Therapy  [facility] for tasks assessed/performed       Past Medical History:  Diagnosis Date  . Anemia     Past Surgical History:  Procedure Laterality Date  . CESAREAN SECTION     x2    There were no vitals filed for this visit.  Subjective Assessment - 08/08/18 1503    Subjective  I think I am doing better with the dry needling last time but my pain is never better than 3/10    Pertinent History  nothing remarkable  just 2 C sections    Currently in Pain?  No/denies    Pain Score  3     Pain Location  Hip    Pain Orientation  Left    Pain Descriptors / Indicators  Aching;Sore    Pain Type  Chronic pain    Pain Onset  More than a month ago    Pain Frequency  Intermittent                       [facility] Adult PT Treatment/Exercise - 08/08/18 0001      Lumbar Exercises: Aerobic   Elliptical  6 min    warm up     Knee/Hip Exercises: Standing   Other Standing Knee Exercises  R DL  3 x 12   marching with 4 kor fitness band    Other Standing Knee Exercises  lunges on Ther ex pad x 10 right and left, standing hip flexion R and L with 10 lb KB on toes,  standing hip abduction with heavy duty 4KOR fitness  strap 3 x 12 on left   pain on ant left hip when returning to standing     Knee/Hip Exercises: Supine   Other Supine  Knee/Hip Exercises  bridges with 45 # weight,       Knee/Hip Exercises: Sidelying   Other Sidelying Knee/Hip Exercises  IR with pink ball between knees x10       Knee/Hip Exercises: Prone   Other Prone Exercises  q ped fire hydrant with 4 KOR fitness band on left 3 x 12      Manual Therapy   Manual Therapy  Soft tissue mobilization    Soft tissue mobilization  trigger point medical rectus femoris proximal trigger point pressure              PT Education - 08/08/18 1535    Education Details  added more advanced HEP with added weight    Person(s) Educated  Patient    Methods  Explanation;Demonstration;Tactile cues;Verbal cues;Handout    Comprehension  Verbalized understanding;Returned demonstration       PT Short Term Goals - 08/01/18 1151  PT SHORT TERM GOAL #1   Title  Pt will be independent with initial HEP    Time  2    Period  Weeks    Status  Achieved      PT SHORT TERM GOAL #2   Title  Pt will be able to perform hip flexor stretch pain free    Baseline  performing daily pain free     Time  2    Period  Weeks    Status  Achieved        PT Long Term Goals - 08/01/18 1151      PT LONG TERM GOAL #1   Title  Pt will be independent with advanced HEP    Time  6    Period  Weeks    Target Date  09/12/18      PT LONG TERM GOAL #2   Title  Pt will be able to perform hip flexor stretch without pain    Baseline  performed thomas test stretch in supine without any pain  but unable to do lunge without pain    Time  6    Period  Weeks    Status  Partially Met    Target Date  09/12/18      PT LONG TERM GOAL #3   Title  "Pt will improve L hip flexor strength to >/= 4+/5 with </= 2/10 pain to promote safety with walking/standing activities    Baseline  pain in4/10  MMT LE at least 4/5 in all and higher improved from eval but still working on goals    Time  6    Period  Weeks    Status  On-going      PT LONG TERM GOAL #4   Title  Pt will be able to return  to weight lifting activities utiilizing safety and hip joint protections    Baseline  Pt unable to lift weights or return to [employer] duties and physical activity due to left anterior hip pain     Time  6    Period  Weeks    Status  On-going      PT LONG TERM GOAL #5   Title  "FOTO will improve from 50% limitation    to  28 % limtation   indicating improved functional mobility .     Baseline  08-02-18 50%limitation   No change from evalauation    Time  6    Period  Weeks    Status  On-going    Target Date  09/12/18            Plan - 08/08/18 1543    Clinical Impression Statement  Pt doing better at 3/10 pain still in groin specifically medial proximal quad with trigger point. 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  Pt was challenged today with a Team 4KOR heavy wt resistance banc and higher weights with exercises.  Pt is very compliant with exericises but has improved but not eliminated pain in left anterior hip. PT declined TPDN  today and will see if more challenging weiights and loading can help with pain.  Pt to continue with sports emphasis with Carolyne Littles PT     Rehab Potential  Good    PT Frequency  2x / week    PT Duration  6 weeks    PT Treatment/Interventions  Cryotherapy;Iontophoresis 37m/ml Dexamethasone;Moist Heat;Ultrasound;Therapeutic exercise;Therapeutic  activities;Functional mobility training;Neuromuscular re-education;Patient/family education;Manual techniques;Passive range of motion;Dry needling;Taping;Joint Manipulations    PT Next Visit Plan   progress into pre running drills; possibly treadmill  RE eval last 08-02-18  Check to see if she should pursue further testing if pain is 3/10 or highter    PT Home Exercise Plan  HEP without resisistance.  single limb bridge  with post. tilt for pain free left hip flex. clams w/ red band, side plank, fire hydrant, figure 4 IR stretch, adductor squeeze. Hip  extension in quadraped with ball squeeze in between knee, thomas test stretch    Consulted and Agree with Plan of Care  Patient       Patient will benefit from skilled therapeutic intervention in order to improve the following deficits and impairments:  Decreased activity tolerance, Decreased mobility, Decreased range of motion, Decreased strength, Impaired flexibility, Pain  Visit Diagnosis: Stiffness of left hip, not elsewhere classified  Muscle weakness (generalized)  Pain in left hip     Problem List Patient Active Problem List   Diagnosis Date Noted  . Hair thinning 03/06/2018  . Pain of left hip joint 03/06/2018   Voncille Lo, PT Certified Exercise Expert for the Aging Adult  08/08/18 5:30 PM Phone: [phone] Fax: [facility] [address] [place], [name], [address] Phone: [phone]   Fax:  [phone]  Name: Mailynn Everly MRN: [id number] Date of Birth: [birth year]

## 2018-08-11 ENCOUNTER — Ambulatory Visit: Admitting: Physical Therapy

## 2018-08-16 ENCOUNTER — Ambulatory Visit: Admitting: Physical Therapy

## 2018-08-18 ENCOUNTER — Ambulatory Visit: Admitting: Physical Therapy

## 2018-08-18 ENCOUNTER — Ambulatory Visit (INDEPENDENT_AMBULATORY_CARE_PROVIDER_SITE_OTHER): Admitting: Family Medicine

## 2018-08-18 ENCOUNTER — Encounter: Payer: Self-pay | Admitting: Family Medicine

## 2018-08-18 VITALS — BP 119/63 | HR 80 | Ht 68.0 in | Wt 187.0 lb

## 2018-08-18 DIAGNOSIS — M25552 Pain in left hip: Secondary | ICD-10-CM | POA: Diagnosis not present

## 2018-08-18 NOTE — Patient Instructions (Signed)
Continue physical therapy and home exercises - over next 6 weeks as you feel comfortable you can wean to just a home exercise program. Icing 15 minutes at a time as needed. You can take tylenol and/or aleve as needed for pain. Still no PT testing. Consider MRI arthrogram of your hip if you're not improving as expected. Follow up with me in 6 weeks to 3 months for reevaluation.

## 2018-08-21 ENCOUNTER — Ambulatory Visit: Admitting: Physical Therapy

## 2018-08-21 ENCOUNTER — Encounter: Payer: Self-pay | Admitting: Physical Therapy

## 2018-08-21 DIAGNOSIS — M25652 Stiffness of left hip, not elsewhere classified: Secondary | ICD-10-CM | POA: Diagnosis not present

## 2018-08-21 DIAGNOSIS — M6281 Muscle weakness (generalized): Secondary | ICD-10-CM

## 2018-08-21 DIAGNOSIS — M25552 Pain in left hip: Secondary | ICD-10-CM

## 2018-08-22 ENCOUNTER — Encounter: Payer: Self-pay | Admitting: Physical Therapy

## 2018-08-22 NOTE — Therapy (Signed)
[place] Norman, [name], [address] Phone: [phone]   Fax:  [phone]  Physical Therapy Treatment  Patient Details  Name: Kiara Norman MRN: [id number] Date of Birth: 1986-06-08 Referring Provider (PT): Chryl Heck, MD   Encounter Date: 08/21/2018  PT End of Session - 08/21/18 1534    Visit Number  10    Number of Visits  20    Date for PT Re-Evaluation  09/12/18    Authorization Type  [employer]IC[employer]  PT only    PT Start Time  1500    PT Stop Time  1543    PT Time Calculation (min)  43 min    Activity Tolerance  Patient tolerated treatment well    Behavior During Therapy  [facility] for tasks assessed/performed       Past Medical History:  Diagnosis Date  . Anemia     Past Surgical History:  Procedure Laterality Date  . CESAREAN SECTION     x2    There were no vitals filed for this visit.  Subjective Assessment - 08/21/18 1508    Subjective  Patient reports her pain is about the same. She is having pain when she does activity but not too bad when she is not doing any activity. She has been to the MD who advised her to continue with therapy. He feels like even if it is a labral tear the surgeyr will prevent her from being able to go back to normal activity.     Pertinent History  nothing remarkable  just 2 C sections    Limitations  Standing;Walking;Other (comment)    How long can you sit comfortably?  unlimited    How long can you stand comfortably?  standing still is fine    How long can you walk comfortably?  < than 5-7 minutes before getting painful    Diagnostic tests  x rays normal     Patient Stated Goals  goals return to weight lifting and running    Currently in Pain?  Yes  (Pended)     Pain Score  4   (Pended)     Pain Location  Hip  (Pended)     Pain Orientation  Left  (Pended)     Pain Descriptors / Indicators  Aching;Sore  (Pended)     Pain Type  Chronic pain  (Pended)     Pain Onset   More than a month ago  (Pended)     Pain Frequency  Intermittent  (Pended)     Aggravating Factors   running, any type of prolonged activity   (Pended)                        [facility] Adult PT Treatment/Exercise - 08/22/18 0001      Lumbar Exercises: Aerobic   Elliptical  5 min    warm up     Knee/Hip Exercises: Stretches   Piriformis Stretch  3 reps;20 seconds    Other Knee/Hip Stretches  thomas test stretch with arms overhead 3x30 sesc; firgure 4 stretch 4x30sec       Knee/Hip Exercises: Standing   Other Standing Knee Exercises  kettle bell squat 35 lb; kettle bell swing 15lb 2x10; single leg d2 kettle bell single leg 5 lb      Knee/Hip Exercises: Supine   Other Supine Knee/Hip Exercises  Bridge 2x10 blue; clamshell with bridge blue 2x10       Manual Therapy  Manual Therapy  Joint mobilization;Soft tissue mobilization    Manual therapy comments  skilled PT for TPDN    Joint Mobilization  Grade 3& 4 AP hip mobs; lateral hip glide     Soft tissue mobilization  left quad/ TFL , rolling of the anterior hip        Trigger Point Dry Needling - 08/22/18 0903    Consent Given?  Yes    Education Handout Provided  Yes    Quadriceps Response  Twitch response elicited           PT Education - 08/21/18 1532    Education Details  reviewed HEp and symptom mangement    Person(s) Educated  Patient    Methods  Explanation;Demonstration;Tactile cues;Verbal cues    Comprehension  Verbalized understanding;Verbal cues required;Returned demonstration;Tactile cues required       PT Short Term Goals - 08/22/18 0903      PT SHORT TERM GOAL #1   Title  Pt will be independent with initial HEP    Time  2    Period  Weeks    Status  Achieved      PT SHORT TERM GOAL #2   Title  Pt will be able to perform hip flexor stretch pain free    Baseline  performing daily pain free     Time  2    Period  Weeks    Status  Achieved        PT Long Term Goals - 08/01/18 1151       PT LONG TERM GOAL #1   Title  Pt will be independent with advanced HEP    Time  6    Period  Weeks    Target Date  09/12/18      PT LONG TERM GOAL #2   Title  Pt will be able to perform hip flexor stretch without pain    Baseline  performed thomas test stretch in supine without any pain  but unable to do lunge without pain    Time  6    Period  Weeks    Status  Partially Met    Target Date  09/12/18      PT LONG TERM GOAL #3   Title  "Pt will improve L hip flexor strength to >/= 4+/5 with </= 2/10 pain to promote safety with walking/standing activities    Baseline  pain in4/10  MMT LE at least 4/5 in all and higher improved from eval but still working on goals    Time  6    Period  Weeks    Status  On-going      PT LONG TERM GOAL #4   Title  Pt will be able to return to weight lifting activities utiilizing safety and hip joint protections    Baseline  Pt unable to lift weights or return to [employer] duties and physical activity due to left anterior hip pain     Time  6    Period  Weeks    Status  On-going      PT LONG TERM GOAL #5   Title  "FOTO will improve from 50% limitation    to  28 % limtation   indicating improved functional mobility .     Baseline  08-02-18 50%limitation   No change from evalauation    Time  6    Period  Weeks    Status  On-going    Target Date  09/12/18  Plan - 08/22/18 0900    Clinical Impression Statement  Patient had a good twtich respose to needling of the anterior hip. Therapy worked on strengthening and high level stability ecercixes. She reported no pain buyt she ussualy has pain later. Per MD continue strengthening. She did have a pop with kettle bell swing.     Clinical Presentation  Stable    Clinical Decision Making  Low    Rehab Potential  Good    PT Frequency  2x / week    PT Duration  6 weeks    PT Treatment/Interventions  Cryotherapy;Iontophoresis 3m/ml Dexamethasone;Moist Heat;Ultrasound;Therapeutic  exercise;Therapeutic activities;Functional mobility training;Neuromuscular re-education;Patient/family education;Manual techniques;Passive range of motion;Dry needling;Taping;Joint Manipulations    PT Next Visit Plan   progress into pre running drills; possibly treadmill  RE eval last 08-02-18  Check to see if she should pursue further testing if pain is 3/10 or highter    PT Home Exercise Plan  HEP without resisistance.  single limb bridge  with post. tilt for pain free left hip flex. clams w/ red band, side plank, fire hydrant, figure 4 IR stretch, adductor squeeze. Hip extension in quadraped with ball squeeze in between knee, thomas test stretch    Consulted and Agree with Plan of Care  Patient       Patient will benefit from skilled therapeutic intervention in order to improve the following deficits and impairments:  Decreased activity tolerance, Decreased mobility, Decreased range of motion, Decreased strength, Impaired flexibility, Pain  Visit Diagnosis: Stiffness of left hip, not elsewhere classified  Muscle weakness (generalized)  Pain in left hip     Problem List Patient Active Problem List   Diagnosis Date Noted  . Hair thinning 03/06/2018  . Pain of left hip joint 03/06/2018    DCarney LivingPT DPT  08/22/2018, 9:05 AM  [facility] [address]Phone: [phone]  Fax:  [phone] Name: AMykala MccreadyMRN: [id number] of Birth: 603-20-1987

## 2018-08-23 ENCOUNTER — Encounter: Payer: Self-pay | Admitting: Family Medicine

## 2018-08-23 NOTE — Progress Notes (Signed)
PCP and consultation requested by: Sharlene DoryWendling, Nicholas Paul, DO  Subjective:   HPI: Patient is a 32 y.o. female here for left hip pain.  9/27: Patient reports she's had popping of her left hip for about 2 years but didn't start getting pain with this until about 3-4 months ago. Now bothering with running and walking especially. Pain level 2/10, can be sharp. No radiation beyond groin area. Has rare back pain but feels it's unrelated and does not occur with her hip pain. No numbness or tingling. No skin changes.  12/20: Patient reports she's feeling better than last visit. Pain gets up to 5/10 and sharp at times. Doing physical therapy and home exercises, noticing some improvement. Ok with walking and work but worse with exercise. Some popping of hip. No numbness, tingling. No back pain.  Past Medical History:  Diagnosis Date  . Anemia     Current Outpatient Medications on File Prior to Visit  Medication Sig Dispense Refill  . norgestimate-ethinyl estradiol (ESTARYLLA) 0.25-35 MG-MCG tablet Take 1 tablet by mouth daily. 1 Package 11   No current facility-administered medications on file prior to visit.     Past Surgical History:  Procedure Laterality Date  . CESAREAN SECTION     x2    No Known Allergies  Social History   Socioeconomic History  . Marital status: Single    Spouse name: Not on file  . Number of children: Not on file  . Years of education: Not on file  . Highest education level: Not on file  Occupational History  . Not on file  Social Needs  . Financial resource strain: Not on file  . Food insecurity:    Worry: Not on file    Inability: Not on file  . Transportation needs:    Medical: Not on file    Non-medical: Not on file  Tobacco Use  . Smoking status: Never Smoker  . Smokeless tobacco: Never Used  Substance and Sexual Activity  . Alcohol use: Not Currently  . Drug use: Never  . Sexual activity: Not on file  Lifestyle  . Physical  activity:    Days per week: Not on file    Minutes per session: Not on file  . Stress: Not on file  Relationships  . Social connections:    Talks on phone: Not on file    Gets together: Not on file    Attends religious service: Not on file    Active member of club or organization: Not on file    Attends meetings of clubs or organizations: Not on file    Relationship status: Not on file  . Intimate partner violence:    Fear of current or ex partner: Not on file    Emotionally abused: Not on file    Physically abused: Not on file    Forced sexual activity: Not on file  Other Topics Concern  . Not on file  Social History Narrative  . Not on file    Family History  Problem Relation Age of Onset  . Arthritis Mother   . Arthritis Paternal Grandfather     BP 119/63   Pulse 80   Ht 5\' 8"  (1.727 m)   Wt 187 lb (84.8 kg)   BMI 28.43 kg/m   Review of Systems: See HPI above.     Objective:  Physical Exam:  Gen: NAD, comfortable in exam room  Back: No gross deformity, scoliosis. No TTP .  No midline or bony  TTP. FROM without pain. Strength LEs 5/5 all muscle groups.   Negative SLRs. Sensation intact to light touch bilaterally.  Left hip: No deformity. FROM with 5/5 strength. No tenderness to palpation. NVI distally. Minimal pain with logroll Negative fabers and piriformis stretches.  Assessment & Plan:  1. Left hip pain - Radiographs negative.  Consistent with snapping hip vs labral tear.  Mild improvement to date and not severe enough per patient to warrant MRI arthrogram.  Continue physical therapy, home exercises.  Icing, tylenol, aleve if needed.  F/u in 6 weeks to 3 months.

## 2018-08-25 ENCOUNTER — Ambulatory Visit: Admitting: Physical Therapy

## 2018-08-28 ENCOUNTER — Encounter: Payer: Self-pay | Admitting: Physical Therapy

## 2018-08-28 ENCOUNTER — Ambulatory Visit: Admitting: Physical Therapy

## 2018-08-28 DIAGNOSIS — M25652 Stiffness of left hip, not elsewhere classified: Secondary | ICD-10-CM | POA: Diagnosis not present

## 2018-08-28 DIAGNOSIS — M25552 Pain in left hip: Secondary | ICD-10-CM

## 2018-08-28 DIAGNOSIS — M6281 Muscle weakness (generalized): Secondary | ICD-10-CM

## 2018-08-28 NOTE — Therapy (Signed)
[place], [name], [address] Phone: [phone]   Fax:  [phone]  Physical Therapy Treatment  Patient Details  Name: Kiara Norman MRN: [id number] Date of Birth: 1985/10/20 Referring Provider (PT): Chryl Heck, MD   Encounter Date: 08/28/2018  PT End of Session - 08/28/18 0917    Visit Number  11    Number of Visits  20    Date for PT Re-Evaluation  09/12/18    Authorization Type  [employer]  PT only    PT Start Time  0848    PT Stop Time  0930    PT Time Calculation (min)  42 min    Activity Tolerance  Patient tolerated treatment well    Behavior During Therapy  [facility] for tasks assessed/performed       Past Medical History:  Diagnosis Date  . Anemia     Past Surgical History:  Procedure Laterality Date  . CESAREAN SECTION     x2    There were no vitals filed for this visit.  Subjective Assessment - 08/28/18 0911    Subjective  Patient was a little sore after the needling the last visit. She was able to use her stretches and exercises to get her out of pain. She just has a minor achetoday. She missed her last visit 2nd to a family emergency.     Pertinent History  nothing remarkable  just 2 C sections    Limitations  Standing;Walking;Other (comment)    How long can you sit comfortably?  unlimited    How long can you stand comfortably?  standing still is fine    How long can you walk comfortably?  < than 5-7 minutes before getting painful    Diagnostic tests  x rays normal     Patient Stated Goals  goals return to weight lifting and running    Currently in Pain?  No/denies                       [facility] Adult PT Treatment/Exercise - 08/28/18 0001      Lumbar Exercises: Aerobic   Elliptical  5 min    warm up     Knee/Hip Exercises: Stretches   Active Hamstring Stretch  3 reps;20 seconds    Piriformis Stretch  3 reps;20 seconds    Other Knee/Hip Stretches  thomas test  stretch with arms overhead 3x30 sesc; firgure 4 stretch 4x30sec       Knee/Hip Exercises: Supine   Other Supine Knee/Hip Exercises  Bridge 2x10 blue; clamshell with bridge blue 2x10     Other Supine Knee/Hip Exercises  supine march blue x20       Manual Therapy   Manual Therapy  Joint mobilization;Soft tissue mobilization    Joint Mobilization  Grade 3& 4 AP hip mobs; lateral hip glide     Soft tissue mobilization  left quad/ TFL , rolling of the anterior hip              PT Education - 08/28/18 0916    Education Details  reviewed technique with HEP     Person(s) Educated  Patient    Methods  Explanation;Demonstration;Tactile cues;Verbal cues    Comprehension  Verbalized understanding;Returned demonstration;Tactile cues required;Verbal cues required       PT Short Term Goals - 08/28/18 1218      PT SHORT TERM GOAL #1   Title  Pt will be independent with initial HEP  Time  2    Period  Weeks    Status  Achieved      PT SHORT TERM GOAL #2   Title  Pt will be able to perform hip flexor stretch pain free    Baseline  performing daily pain free     Time  2    Period  Weeks    Status  Achieved        PT Long Term Goals - 08/01/18 1151      PT LONG TERM GOAL #1   Title  Pt will be independent with advanced HEP    Time  6    Period  Weeks    Target Date  09/12/18      PT LONG TERM GOAL #2   Title  Pt will be able to perform hip flexor stretch without pain    Baseline  performed thomas test stretch in supine without any pain  but unable to do lunge without pain    Time  6    Period  Weeks    Status  Partially Met    Target Date  09/12/18      PT LONG TERM GOAL #3   Title  "Pt will improve L hip flexor strength to >/= 4+/5 with </= 2/10 pain to promote safety with walking/standing activities    Baseline  pain in4/10  MMT LE at least 4/5 in all and higher improved from eval but still working on goals    Time  6    Period  Weeks    Status  On-going      PT  LONG TERM GOAL #4   Title  Pt will be able to return to weight lifting activities utiilizing safety and hip joint protections    Baseline  Pt unable to lift weights or return to [employer] duties and physical activity due to left anterior hip pain     Time  6    Period  Weeks    Status  On-going      PT LONG TERM GOAL #5   Title  "FOTO will improve from 50% limitation    to  28 % limtation   indicating improved functional mobility .     Baseline  08-02-18 50%limitation   No change from evalauation    Time  6    Period  Weeks    Status  On-going    Target Date  09/12/18            Plan - 08/28/18 1213    Clinical Impression Statement  Patient had no significant increase in pain today. Therapy focused more on signle leg stability and strengthening. Therapy deffered needlingthis visit but continued with manual therapy to the hip.     Clinical Presentation  Stable    Clinical Decision Making  Low    Rehab Potential  Good    PT Frequency  2x / week    PT Duration  6 weeks    PT Treatment/Interventions  Cryotherapy;Iontophoresis 81m/ml Dexamethasone;Moist Heat;Ultrasound;Therapeutic exercise;Therapeutic activities;Functional mobility training;Neuromuscular re-education;Patient/family education;Manual techniques;Passive range of motion;Dry needling;Taping;Joint Manipulations    PT Next Visit Plan  continue with strengthening. Consider TPDN if needed; Continue with manual therapy     PT Home Exercise Plan  HEP without resisistance.  single limb bridge  with post. tilt for pain free left hip flex. clams w/ red band, side plank, fire hydrant, figure 4 IR stretch, adductor squeeze. Hip extension in quadraped with ball squeeze  in between knee, thomas test stretch    Consulted and Agree with Plan of Care  Patient       Patient will benefit from skilled therapeutic intervention in order to improve the following deficits and impairments:  Decreased activity tolerance, Decreased mobility,  Decreased range of motion, Decreased strength, Impaired flexibility, Pain  Visit Diagnosis: Stiffness of left hip, not elsewhere classified  Muscle weakness (generalized)  Pain in left hip     Problem List Patient Active Problem List   Diagnosis Date Noted  . Hair thinning 03/06/2018  . Pain of left hip joint 03/06/2018    Carney Living PT DPT  08/28/2018, 1:01 PM  [facility] [address] [place], [name], [address] Phone: [phone]   Fax:  [phone]  Name: Kiara Norman MRN: [id number] Date of Birth: [birth year]

## 2018-09-01 ENCOUNTER — Ambulatory Visit: Admitting: Physical Therapy

## 2018-09-05 ENCOUNTER — Ambulatory Visit: Attending: Family Medicine | Admitting: Physical Therapy

## 2018-09-05 DIAGNOSIS — M25652 Stiffness of left hip, not elsewhere classified: Secondary | ICD-10-CM

## 2018-09-05 DIAGNOSIS — M25552 Pain in left hip: Secondary | ICD-10-CM | POA: Insufficient documentation

## 2018-09-05 DIAGNOSIS — M6281 Muscle weakness (generalized): Secondary | ICD-10-CM | POA: Insufficient documentation

## 2018-09-06 ENCOUNTER — Encounter: Payer: Self-pay | Admitting: Physical Therapy

## 2018-09-06 NOTE — Therapy (Signed)
[place], [name], [address] Phone: [phone]   Fax:  [phone]  Physical Therapy Treatment  Patient Details  Name: Kiara Norman MRN: [id number] Date of Birth: 10/05/1985 Referring Provider (PT): Chryl Heck, MD   Encounter Date: 09/05/2018  PT End of Session - 09/06/18 1957    Visit Number  12    Number of Visits  20    Date for PT Re-Evaluation  09/12/18    Authorization Type  [employer]IC[employer]  PT only    PT Start Time  1331    PT Stop Time  1411    PT Time Calculation (min)  40 min    Activity Tolerance  Patient tolerated treatment well    Behavior During Therapy  [facility] for tasks assessed/performed       Past Medical History:  Diagnosis Date  . Anemia     Past Surgical History:  Procedure Laterality Date  . CESAREAN SECTION     x2    There were no vitals filed for this visit.  Subjective Assessment - 09/06/18 1955    Subjective  Patient has been going to th gym without pain. She has drill this weekend.     Pertinent History  nothing remarkable  just 2 C sections    How long can you sit comfortably?  unlimited    How long can you stand comfortably?  standing still is fine    How long can you walk comfortably?  < than 5-7 minutes before getting painful    Diagnostic tests  x rays normal     Patient Stated Goals  goals return to weight lifting and running    Currently in Pain?  No/denies                       [facility] Adult PT Treatment/Exercise - 09/06/18 0001      Lumbar Exercises: Aerobic   Elliptical  5 min    warm up     Knee/Hip Exercises: Stretches   Active Hamstring Stretch  3 reps;20 seconds    Piriformis Stretch  3 reps;20 seconds    Other Knee/Hip Stretches  thomas test stretch with arms overhead 3x30 sesc; firgure 4 stretch 4x30sec       Knee/Hip Exercises: Standing   Rebounder  x20 on air-ex     Other Standing Knee Exercises  kettle bell squat 35 lb; kettle  bell swing 15lb 2x10; single leg d2 kettle bell single leg 5 lb    Other Standing Knee Exercises  step onto air-ex x20; lateral stpe onto air-ex x20; lateral band walk x10 each way blue;              PT Education - 09/06/18 1956    Education Details  benefits of single leg stability strengtheing     Person(s) Educated  Patient    Methods  Explanation;Demonstration;Tactile cues;Verbal cues    Comprehension  Verbalized understanding;Returned demonstration;Verbal cues required;Tactile cues required       PT Short Term Goals - 08/28/18 1218      PT SHORT TERM GOAL #1   Title  Pt will be independent with initial HEP    Time  2    Period  Weeks    Status  Achieved      PT SHORT TERM GOAL #2   Title  Pt will be able to perform hip flexor stretch pain free    Baseline  performing daily pain  free     Time  2    Period  Weeks    Status  Achieved        PT Long Term Goals - 08/01/18 1151      PT LONG TERM GOAL #1   Title  Pt will be independent with advanced HEP    Time  6    Period  Weeks    Target Date  09/12/18      PT LONG TERM GOAL #2   Title  Pt will be able to perform hip flexor stretch without pain    Baseline  performed thomas test stretch in supine without any pain  but unable to do lunge without pain    Time  6    Period  Weeks    Status  Partially Met    Target Date  09/12/18      PT LONG TERM GOAL #3   Title  "Pt will improve L hip flexor strength to >/= 4+/5 with </= 2/10 pain to promote safety with walking/standing activities    Baseline  pain in4/10  MMT LE at least 4/5 in all and higher improved from eval but still working on goals    Time  6    Period  Weeks    Status  On-going      PT LONG TERM GOAL #4   Title  Pt will be able to return to weight lifting activities utiilizing safety and hip joint protections    Baseline  Pt unable to lift weights or return to [employer] duties and physical activity due to left anterior hip pain     Time  6     Period  Weeks    Status  On-going      PT LONG TERM GOAL #5   Title  "FOTO will improve from 50% limitation    to  28 % limtation   indicating improved functional mobility .     Baseline  08-02-18 50%limitation   No change from evalauation    Time  6    Period  Weeks    Status  On-going    Target Date  09/12/18            Plan - 09/06/18 1958    Clinical Impression Statement  Patient appears to be making progress. She will have her drill this weekend. She was advised to continue strethcing if she gets flaired up and perfrom self soft tissue mobilization.     Clinical Presentation  Stable    Clinical Decision Making  Low    Rehab Potential  Good    PT Frequency  2x / week    PT Duration  6 weeks    PT Treatment/Interventions  Cryotherapy;Iontophoresis 58m/ml Dexamethasone;Moist Heat;Ultrasound;Therapeutic exercise;Therapeutic activities;Functional mobility training;Neuromuscular re-education;Patient/family education;Manual techniques;Passive range of motion;Dry needling;Taping;Joint Manipulations    PT Next Visit Plan  continue with strengthening. Consider TPDN if needed; Continue with manual therapy     PT Home Exercise Plan  HEP without resisistance.  single limb bridge  with post. tilt for pain free left hip flex. clams w/ red band, side plank, fire hydrant, figure 4 IR stretch, adductor squeeze. Hip extension in quadraped with ball squeeze in between knee, thomas test stretch    Consulted and Agree with Plan of Care  Patient       Patient will benefit from skilled therapeutic intervention in order to improve the following deficits and impairments:  Decreased activity tolerance, Decreased mobility, Decreased  range of motion, Decreased strength, Impaired flexibility, Pain  Visit Diagnosis: Stiffness of left hip, not elsewhere classified  Muscle weakness (generalized)  Pain in left hip     Problem List Patient Active Problem List   Diagnosis Date Noted  . Hair  thinning 03/06/2018  . Pain of left hip joint 03/06/2018    Carney Living PT 09/06/2018, 8:06 PM  [facility] [address] [place], [name], [address] Phone: [phone]   Fax:  [phone]  Name: Regena Delucchi MRN: [id number] Date of Birth: 24-Oct-1985

## 2018-09-15 ENCOUNTER — Encounter: Payer: Self-pay | Admitting: Physical Therapy

## 2018-09-15 ENCOUNTER — Ambulatory Visit: Admitting: Physical Therapy

## 2018-09-15 DIAGNOSIS — M6281 Muscle weakness (generalized): Secondary | ICD-10-CM

## 2018-09-15 DIAGNOSIS — M25652 Stiffness of left hip, not elsewhere classified: Secondary | ICD-10-CM

## 2018-09-15 DIAGNOSIS — M25552 Pain in left hip: Secondary | ICD-10-CM

## 2018-09-15 NOTE — Therapy (Signed)
[place], [name], [address] Phone: [phone]   Fax:  [phone]  Physical Therapy Treatment  Patient Details  Name: Kiara Norman MRN: [id number] Date of Birth: July 27, 1986 Referring Provider (PT): Chryl Heck, MD   Encounter Date: 09/15/2018  PT End of Session - 09/15/18 0936    Visit Number  13    Number of Visits  20    Date for PT Re-Evaluation  09/12/18    Authorization Type  [employer]  PT only    PT Start Time  0930    PT Stop Time  1013    PT Time Calculation (min)  43 min    Activity Tolerance  Patient tolerated treatment well    Behavior During Therapy  [facility] for tasks assessed/performed       Past Medical History:  Diagnosis Date  . Anemia     Past Surgical History:  Procedure Laterality Date  . CESAREAN SECTION     x2    There were no vitals filed for this visit.  Subjective Assessment - 09/15/18 0934    Subjective  Patient reports over the wekend she had to do a lot of climbing on ladders. Her anterior hip became sore. It has gotten better through the week. She has been doing her ewxercises.     Pertinent History  nothing remarkable  just 2 C sections    Limitations  Standing;Walking;Other (comment)    How long can you sit comfortably?  unlimited    How long can you stand comfortably?  standing still is fine    How long can you walk comfortably?  < than 5-7 minutes before getting painful    Diagnostic tests  x rays normal     Patient Stated Goals  goals return to weight lifting and running    Currently in Pain?  Yes    Pain Score  3     Pain Location  Hip    Pain Orientation  Left    Pain Descriptors / Indicators  Aching;Sore    Pain Type  Chronic pain    Pain Onset  More than a month ago    Pain Frequency  Intermittent    Aggravating Factors   running, climbing ladders     Pain Relieving Factors  trying to stretch    Multiple Pain Sites  No                        [facility] Adult PT Treatment/Exercise - 09/15/18 0001      Lumbar Exercises: Aerobic   Elliptical  5 min    warm up     Knee/Hip Exercises: Stretches   Active Hamstring Stretch  3 reps;20 seconds    Piriformis Stretch  3 reps;20 seconds    Other Knee/Hip Stretches  thomas test stretch with arms overhead 3x30 sesc; firgure 4 stretch 4x30sec       Knee/Hip Exercises: Standing   Lateral Step Up  2 sets;Step Height: 8";10 reps    Forward Step Up  2 sets;10 reps;Step Height: 8"    Rebounder  x20 on air-ex     Other Standing Knee Exercises  step onto air-ex x20; lateral stpe onto air-ex x20; lateral band walk x10 each way blue;       Knee/Hip Exercises: Supine   Bridges  2 sets;10 reps    Other Supine Knee/Hip Exercises  Bridge 2x10 blue; clamshell with bridge blue 2x10  Manual Therapy   Manual therapy comments  skilled palapation of trigger points     Soft tissue mobilization  soft tissue mobilization to the anterior hip on the left using IASTM        Trigger Point Dry Needling - 09/15/18 1032    Consent Given?  Yes    Tensor Fascia Lata Response  Twitch response elicited    Quadriceps Response  Twitch response elicited           PT Education - 09/15/18 0936    Education Details  reviewed the benefits and irisk of TPDN     Person(s) Educated  Patient    Methods  Explanation;Demonstration;Verbal cues;Tactile cues    Comprehension  Verbalized understanding;Returned demonstration;Verbal cues required;Tactile cues required       PT Short Term Goals - 08/28/18 1218      PT SHORT TERM GOAL #1   Title  Pt will be independent with initial HEP    Time  2    Period  Weeks    Status  Achieved      PT SHORT TERM GOAL #2   Title  Pt will be able to perform hip flexor stretch pain free    Baseline  performing daily pain free     Time  2    Period  Weeks    Status  Achieved        PT Long Term Goals - 08/01/18 1151      PT LONG TERM  GOAL #1   Title  Pt will be independent with advanced HEP    Time  6    Period  Weeks    Target Date  09/12/18      PT LONG TERM GOAL #2   Title  Pt will be able to perform hip flexor stretch without pain    Baseline  performed thomas test stretch in supine without any pain  but unable to do lunge without pain    Time  6    Period  Weeks    Status  Partially Met    Target Date  09/12/18      PT LONG TERM GOAL #3   Title  "Pt will improve L hip flexor strength to >/= 4+/5 with </= 2/10 pain to promote safety with walking/standing activities    Baseline  pain in4/10  MMT LE at least 4/5 in all and higher improved from eval but still working on goals    Time  6    Period  Weeks    Status  On-going      PT LONG TERM GOAL #4   Title  Pt will be able to return to weight lifting activities utiilizing safety and hip joint protections    Baseline  Pt unable to lift weights or return to [employer] duties and physical activity due to left anterior hip pain     Time  6    Period  Weeks    Status  On-going      PT LONG TERM GOAL #5   Title  "FOTO will improve from 50% limitation    to  28 % limtation   indicating improved functional mobility .     Baseline  08-02-18 50%limitation   No change from evalauation    Time  6    Period  Weeks    Status  On-going    Target Date  09/12/18            Plan -  09/15/18 0955    Clinical Impression Statement  Patient had a great twtich respose in her TFL and her lateral thigh. She tolerated ther-ex and stretching well. She had a fair up with her drill but she has been able to get her pain level back down on her own. Therapy reviewed self soft tissue mobilization for today.     Clinical Presentation  Stable    Clinical Decision Making  Low    PT Frequency  2x / week    PT Duration  6 weeks    PT Treatment/Interventions  Cryotherapy;Iontophoresis 26m/ml Dexamethasone;Moist Heat;Ultrasound;Therapeutic exercise;Therapeutic activities;Functional  mobility training;Neuromuscular re-education;Patient/family education;Manual techniques;Passive range of motion;Dry needling;Taping;Joint Manipulations    PT Next Visit Plan  continue with strengthening. Consider TPDN if needed; Continue with manual therapy     PT Home Exercise Plan  HEP without resisistance.  single limb bridge  with post. tilt for pain free left hip flex. clams w/ red band, side plank, fire hydrant, figure 4 IR stretch, adductor squeeze. Hip extension in quadraped with ball squeeze in between knee, thomas test stretch    Consulted and Agree with Plan of Care  Patient       Patient will benefit from skilled therapeutic intervention in order to improve the following deficits and impairments:  Decreased activity tolerance, Decreased mobility, Decreased range of motion, Decreased strength, Impaired flexibility, Pain  Visit Diagnosis: Stiffness of left hip, not elsewhere classified  Muscle weakness (generalized)  Pain in left hip     Problem List Patient Active Problem List   Diagnosis Date Noted  . Hair thinning 03/06/2018  . Pain of left hip joint 03/06/2018    DCarney LivingPT DPT  09/15/2018, 10:40 AM  [facility] [address]Phone: [phone]  Fax:  [phone] Name: Kiara WarnkeMRN: [id number] of Birth: 6January 28, 1987

## 2018-09-22 ENCOUNTER — Ambulatory Visit: Admitting: Physical Therapy

## 2018-09-29 ENCOUNTER — Ambulatory Visit: Admitting: Physical Therapy

## 2018-09-29 ENCOUNTER — Encounter: Payer: Self-pay | Admitting: Physical Therapy

## 2018-09-29 DIAGNOSIS — M25552 Pain in left hip: Secondary | ICD-10-CM

## 2018-09-29 DIAGNOSIS — M25652 Stiffness of left hip, not elsewhere classified: Secondary | ICD-10-CM | POA: Diagnosis not present

## 2018-09-29 DIAGNOSIS — M6281 Muscle weakness (generalized): Secondary | ICD-10-CM

## 2018-10-01 NOTE — Therapy (Signed)
[place], [name], [address] Phone: [phone]   Fax:  [phone]  Physical Therapy Treatment/ Discharge   Patient Details  Name: Kiara Norman MRN: [id number] Date of Birth: 11-21-1985 Referring Provider (PT): Chryl Heck, MD   Encounter Date: 09/29/2018  PT End of Session - 10/01/18 1701    Visit Number  14    Number of Visits  20    Date for PT Re-Evaluation  11/10/18    Authorization Type  [employer]  PT only    PT Start Time  0930    PT Stop Time  1014    PT Time Calculation (min)  44 min    Activity Tolerance  Patient tolerated treatment well    Behavior During Therapy  [facility] for tasks assessed/performed       Past Medical History:  Diagnosis Date  . Anemia     Past Surgical History:  Procedure Laterality Date  . CESAREAN SECTION     x2    There were no vitals filed for this visit.  Subjective Assessment - 10/01/18 1701    Subjective  Patient tired to run but she had pain for a few days after. She is otherwise not having any pain.     Pertinent History  nothing remarkable  just 2 C sections    Limitations  Standing;Walking;Other (comment)    How long can you sit comfortably?  unlimited    How long can you stand comfortably?  standing still is fine    How long can you walk comfortably?  < than 5-7 minutes before getting painful    Diagnostic tests  x rays normal     Patient Stated Goals  goals return to weight lifting and running    Currently in Pain?  No/denies         [facility] PT Assessment - 10/01/18 0001      Observation/Other Assessments   Focus on Therapeutic Outcomes (FOTO)   28% limitation       Squat   Comments  improved squat       Running   Comments  still unable without pain       Single Leg Stance   Comments  good stability       Strength   Overall Strength Comments  5/5 gross                    [facility] Adult PT Treatment/Exercise - 10/01/18 0001       Lumbar Exercises: Aerobic   Elliptical  5 min    warm up     Knee/Hip Exercises: Stretches   Active Hamstring Stretch  3 reps;20 seconds    Piriformis Stretch  3 reps;20 seconds    Other Knee/Hip Stretches  thomas test stretch with arms overhead 3x30 sesc; firgure 4 stretch 4x30sec       Knee/Hip Exercises: Plyometrics   Bilateral Jumping Limitations  forewqzard and back 2x30 sec; side to side 2x30 sec     Box Circuit Limitations  fast feet lateral; forward back cw and ccw 2x30 sec each       Knee/Hip Exercises: Standing   Lateral Step Up  2 sets;Step Height: 8";10 reps    Forward Step Up  2 sets;10 reps;Step Height: 8"    Rebounder  x20 on air-ex     Other Standing Knee Exercises  squat on bosu ball 2x15  PT Short Term Goals - 10/01/18 1654      PT SHORT TERM GOAL #1   Title  Pt will be independent with initial HEP    Time  2    Period  Weeks    Status  Achieved      PT SHORT TERM GOAL #2   Title  Pt will be able to perform hip flexor stretch pain free    Baseline  performing daily pain free     Time  2    Period  Weeks    Status  Achieved        PT Long Term Goals - 10/01/18 1655      PT LONG TERM GOAL #1   Title  Pt will be independent with advanced HEP    Baseline  perfroming advanced activiy     Time  6    Period  Weeks    Status  Achieved      PT LONG TERM GOAL #2   Title  Pt will be able to perform hip flexor stretch without pain    Baseline  performed thomas test stretch in supine without any pain  but unable to do lunge without pain    Time  6    Period  Weeks    Status  Achieved      PT LONG TERM GOAL #3   Title  "Pt will improve L hip flexor strength to >/= 4+/5 with </= 2/10 pain to promote safety with walking/standing activities    Baseline  4+/5 BILATERAL     Time  6    Period  Weeks    Status  Achieved      PT LONG TERM GOAL #4   Title  Pt will be able to return to weight lifting activities utiilizing safety  and hip joint protections    Baseline  is perfroming weight lifting     Time  6    Period  Weeks    Status  Achieved      PT LONG TERM GOAL #5   Title  "FOTO will improve from 50% limitation    to  28 % limtation   indicating improved functional mobility .     Baseline  08-02-18 50%limitation   No change from evalauation    Time  6    Period  Weeks    Status  Achieved            Plan - 10/01/18 1700    Clinical Impression Statement  Patient continues to tolerate ther-ex well. She has full strength but can not run at this time. She continues to have mild tenderness to palpation of the hip flor tendon. Therapy added playometricas as well as continued with return to running activity. She reported no pain after plyometrics. She has reached max benefit fro therapy. She has a program and can mange her pain. she will continue to test out runnin gwhen she can     PT Frequency  2x / week    PT Duration  6 weeks    PT Treatment/Interventions  Cryotherapy;Iontophoresis 10m/ml Dexamethasone;Moist Heat;Ultrasound;Therapeutic exercise;Therapeutic activities;Functional mobility training;Neuromuscular re-education;Patient/family education;Manual techniques;Passive range of motion;Dry needling;Taping;Joint Manipulations    PT Next Visit Plan  continue with strengthening. Consider TPDN if needed; Continue with manual therapy     PT Home Exercise Plan  HEP without resisistance.  single limb bridge  with post. tilt for pain free left hip flex. clams w/ red band, side plank, fire  hydrant, figure 4 IR stretch, adductor squeeze. Hip extension in quadraped with ball squeeze in between knee, thomas test stretch    Consulted and Agree with Plan of Care  Patient       Patient will benefit from skilled therapeutic intervention in order to improve the following deficits and impairments:     Visit Diagnosis: Stiffness of left hip, not elsewhere classified  Muscle weakness (generalized)  Pain in left  hip  PHYSICAL THERAPY DISCHARGE SUMMARY  Visits from Start of Care: 8  Current functional level related to goals / functional outcomes: Continues to have pain with running but can perform all other activity    Remaining deficits: Pain with running    Education / Equipment: HEP   Plan: Patient agrees to discharge.  Patient goals were met. Patient is being discharged due to being pleased with the current functional level.  ?????       Problem List Patient Active Problem List   Diagnosis Date Noted  . Hair thinning 03/06/2018  . Pain of left hip joint 03/06/2018    Carney Living 10/01/2018, 5:02 PM  [facility] [address] [place], [name], [address] Phone: [phone]   Fax:  [phone]  Name: Kiara Norman MRN: [id number] Date of Birth: 1985/11/25

## 2018-10-24 ENCOUNTER — Telehealth: Payer: Self-pay | Admitting: Family Medicine

## 2018-10-24 NOTE — Telephone Encounter (Signed)
Copied from CRM [id number]. 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

## 2018-10-25 ENCOUNTER — Telehealth: Payer: Self-pay | Admitting: Family Medicine

## 2018-10-25 ENCOUNTER — Other Ambulatory Visit: Payer: Self-pay | Admitting: Family Medicine

## 2018-10-25 DIAGNOSIS — Z111 Encounter for screening for respiratory tuberculosis: Secondary | ICD-10-CM

## 2018-10-25 NOTE — Telephone Encounter (Signed)
Ok to schedule at her convenience. TY.

## 2018-10-25 NOTE — Telephone Encounter (Signed)
Called left message on patients number///and mom's number

## 2018-10-25 NOTE — Progress Notes (Signed)
qu

## 2018-10-25 NOTE — Telephone Encounter (Signed)
Pt's mother dropped off a school physical exam form. Copy given to mother and form placed in tray

## 2018-10-25 NOTE — Telephone Encounter (Signed)
Called to schedule lab appt---left message to call back. Did put in order for this

## 2018-10-26 NOTE — Telephone Encounter (Signed)
Called and spoke to her mom and appointment (lab) has been scheduled.

## 2018-10-27 NOTE — Telephone Encounter (Signed)
Completed as much as possible, pt will need hearing & vision screening for form; forwarded to provider/SLS 02/28

## 2018-10-30 NOTE — Telephone Encounter (Signed)
Called the patient left message to call back. She needs a nurse visit appointment to have a vision/hearing test section completed on form.

## 2018-11-01 NOTE — Telephone Encounter (Signed)
Patient returned call and scheduled nurse visit appt on 11/08/2018 Form is on my desk.

## 2018-11-08 ENCOUNTER — Other Ambulatory Visit: Payer: Self-pay

## 2018-11-08 ENCOUNTER — Other Ambulatory Visit (INDEPENDENT_AMBULATORY_CARE_PROVIDER_SITE_OTHER)

## 2018-11-08 ENCOUNTER — Ambulatory Visit (INDEPENDENT_AMBULATORY_CARE_PROVIDER_SITE_OTHER): Admitting: *Deleted

## 2018-11-08 DIAGNOSIS — Z111 Encounter for screening for respiratory tuberculosis: Secondary | ICD-10-CM | POA: Diagnosis not present

## 2018-11-08 DIAGNOSIS — Z0100 Encounter for examination of eyes and vision without abnormal findings: Secondary | ICD-10-CM

## 2018-11-08 DIAGNOSIS — Z0110 Encounter for examination of ears and hearing without abnormal findings: Secondary | ICD-10-CM

## 2018-11-08 NOTE — Progress Notes (Signed)
Patient in today for vision and hearing screening for form per Dr. Carmelia Roller.  Hearing screening done  Vision screening done.  Documented on form and form given to patient.

## 2018-11-10 LAB — QUANTIFERON-TB GOLD PLUS
Mitogen-NIL: >10 IU/mL
NIL: 0.01 IU/mL
QUANTIFERON-TB GOLD PLUS: NEGATIVE
TB1-NIL: 0.02 IU/mL
TB2-NIL: 0.03 IU/mL

## 2018-12-15 DIAGNOSIS — D649 Anemia, unspecified: Secondary | ICD-10-CM | POA: Insufficient documentation

## 2019-06-03 IMAGING — DX DG HIP (WITH OR WITHOUT PELVIS) 2-3V*L*
3 series · 3 of 3 positions shown · non-contrast
Comparison: None.

CLINICAL DATA: Left-sided hip pain for several months, no known
injury, initial encounter

EXAM:
DG HIP (WITH OR WITHOUT PELVIS) 2-3V LEFT

[pelvis ap]
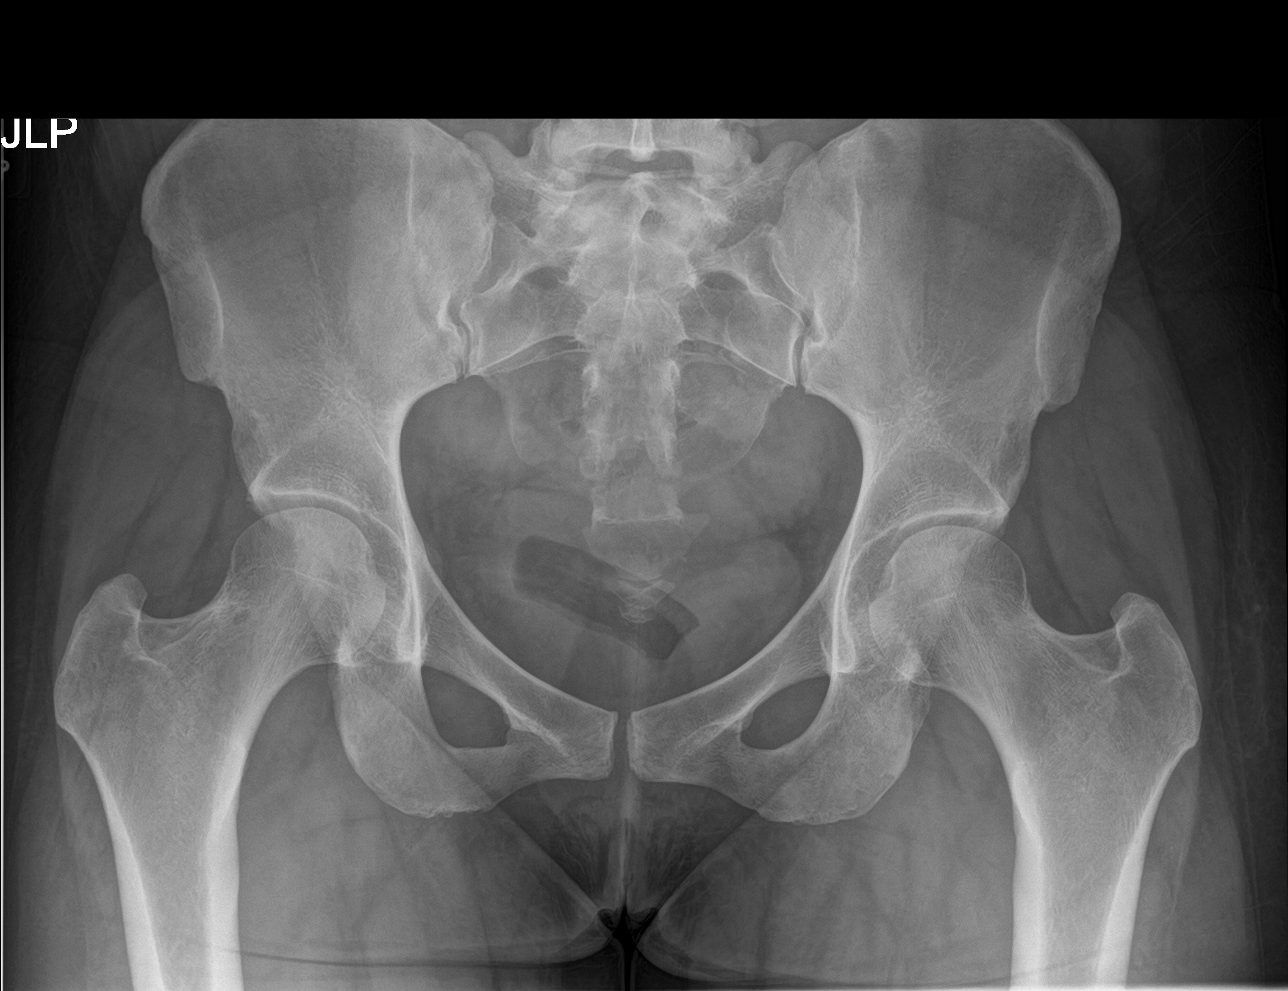

[hip ap]
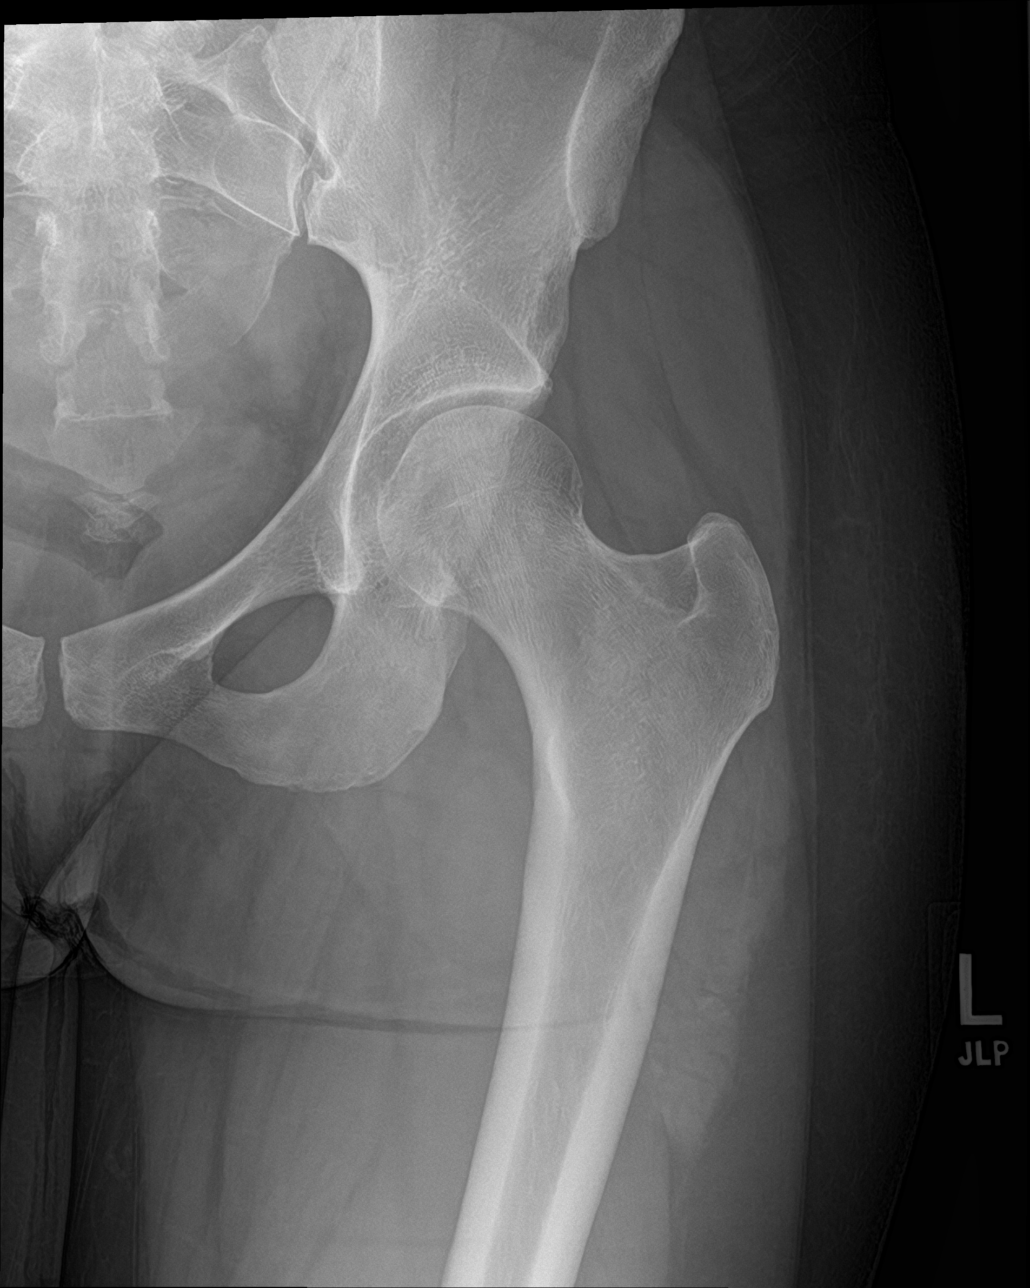

[hip lat]
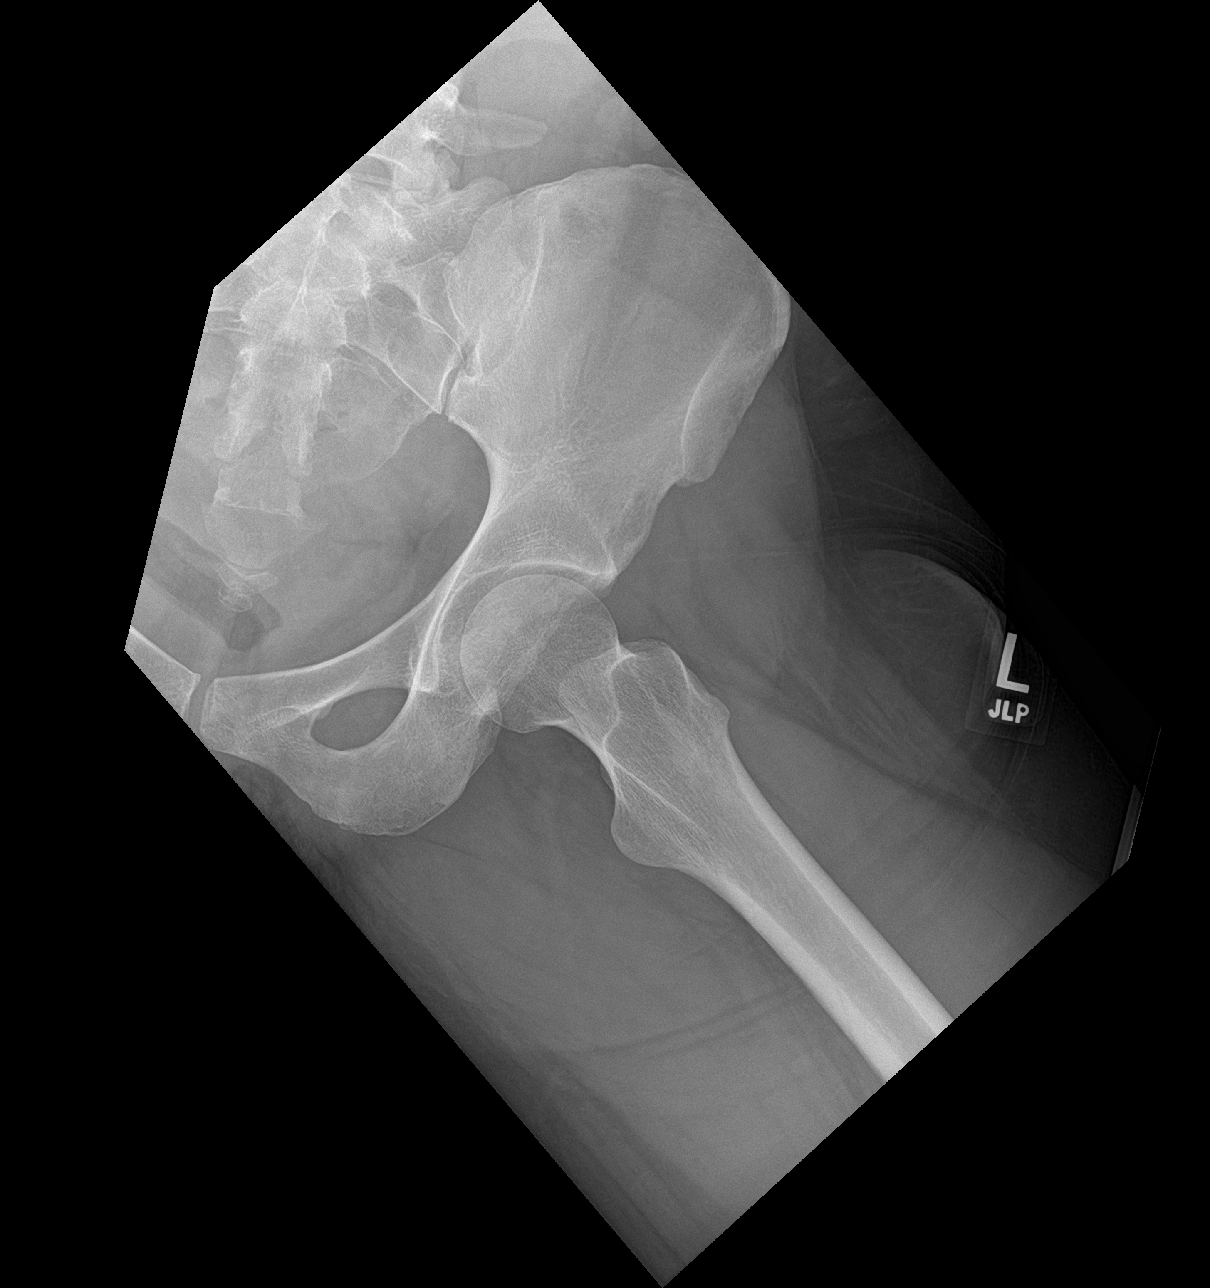

[3 of 3 positions shown; findings below may reference images not displayed]

FINDINGS: Pelvic ring is intact. No acute fracture or dislocation is noted. No
soft tissue changes are seen. Tampon is noted in the vaginal vault.
IMPRESSION: No acute abnormality noted.

## 2019-06-21 MED FILL — FLUARIX QUADRIVALENT 0.5 ML: 0.5 | 1 days supply | Qty: 1 | Fill #0

## 2019-08-03 ENCOUNTER — Ambulatory Visit: Admitting: Family Medicine

## 2019-08-03 ENCOUNTER — Encounter: Payer: Self-pay | Admitting: Family Medicine

## 2019-08-03 NOTE — Progress Notes (Deleted)
  Kiara Norman - 33 y.o. female MRN [id number]  Date of birth: 11/28/1985  SUBJECTIVE:  Including CC & ROS.  No chief complaint on file.   Kiara Norman is a 33 y.o. female that is  ***.  ***   Review of Systems  HISTORY: Past Medical, Surgical, Social, and Family History Reviewed & Updated per EMR.   Pertinent Historical Findings include:  Past Medical History:  Diagnosis Date  . Anemia     Past Surgical History:  Procedure Laterality Date  . CESAREAN SECTION     x2    No Known Allergies  Family History  Problem Relation Age of Onset  . Arthritis Mother   . Arthritis Paternal Grandfather      Social History   Socioeconomic History  . Marital status: Single    Spouse name: Not on file  . Number of children: Not on file  . Years of education: Not on file  . Highest education level: Not on file  Occupational History  . Not on file  Social Needs  . Financial resource strain: Not on file  . Food insecurity    Worry: Not on file    Inability: Not on file  . Transportation needs    Medical: Not on file    Non-medical: Not on file  Tobacco Use  . Smoking status: Never Smoker  . Smokeless tobacco: Never Used  Substance and Sexual Activity  . Alcohol use: Not Currently  . Drug use: Never  . Sexual activity: Not on file  Lifestyle  . Physical activity    Days per week: Not on file    Minutes per session: Not on file  . Stress: Not on file  Relationships  . Social Herbalist on phone: Not on file    Gets together: Not on file    Attends religious service: Not on file    Active member of club or organization: Not on file    Attends meetings of clubs or organizations: Not on file    Relationship status: Not on file  . Intimate partner violence    Fear of current or ex partner: Not on file    Emotionally abused: Not on file    Physically abused: Not on file    Forced sexual activity: Not on file  Other Topics Concern  . Not on file   Social History Narrative  . Not on file     PHYSICAL EXAM:  VS: There were no vitals taken for this visit. Physical Exam Gen: NAD, alert, cooperative with exam, well-appearing ENT: normal lips, normal nasal mucosa,  Eye: normal EOM, normal conjunctiva and lids CV:  no edema, +2 pedal pulses   Resp: no accessory muscle use, non-labored,  GI: no masses or tenderness, no hernia  Skin: no rashes, no areas of induration  Neuro: normal tone, normal sensation to touch Psych:  normal insight, alert and oriented MSK:  ***      ASSESSMENT & PLAN:   No problem-specific Assessment & Plan notes found for this encounter.

## 2019-08-27 ENCOUNTER — Encounter: Payer: Self-pay | Admitting: Family Medicine

## 2019-08-27 ENCOUNTER — Other Ambulatory Visit: Payer: Self-pay

## 2019-08-27 ENCOUNTER — Ambulatory Visit (INDEPENDENT_AMBULATORY_CARE_PROVIDER_SITE_OTHER): Admitting: Family Medicine

## 2019-08-27 DIAGNOSIS — R635 Abnormal weight gain: Secondary | ICD-10-CM | POA: Diagnosis not present

## 2019-08-27 DIAGNOSIS — R194 Change in bowel habit: Secondary | ICD-10-CM

## 2019-08-27 DIAGNOSIS — F418 Other specified anxiety disorders: Secondary | ICD-10-CM

## 2019-08-27 NOTE — Progress Notes (Signed)
CC: Abd pain  Kiara Norman is here for abdominal pain. Due to COVID-19 pandemic, we are interacting via web portal for an electronic face-to-face visit. I verified patient's ID using 2 identifiers. Patient agreed to proceed with visit via this method. Patient is at home, I am at office. Patient and I are present for visit.   Duration: 3 weeks Nighttime awakenings? No Bleeding? No Weight loss? No Palliation: BM's, passing gas Provocation: No ID'd triggers Associated symptoms: nausea Denies: fever, vomiting and inability to keep down fluids Treatment to date: cut out greasy foods, exercise  Has gained around 20 lbs over past year. Has cut down a little w workouts, but not enough to gain 20 lbs. Diet is fair. She does stress eat and has been under more stress w working, going to school and parenting. TSH in 2019 neg. No other endo s/s's.   ROS: Constitutional: No fevers GI: No N/V/D/C, no bleeding + pain  Past Medical History:  Diagnosis Date  . Anemia    Family History  Problem Relation Age of Onset  . Arthritis Mother   . Arthritis Paternal Grandfather    Past Surgical History:  Procedure Laterality Date  . CESAREAN SECTION     x2   Exam No conversational dyspnea Age appropriate judgment and insight Nml affect and mood  Bowel habit changes  Situational anxiety  Weight gain  Sounds like IBS. Metamucil, exercise counseling, stay hydrated. If no improvement, will send message in 2-3 weeks and we will start low dose of Elavil. Counseled on diet and exercise. Will keep food journal to track calories. I suspect she is stress eating.  Pt voiced understanding and agreement to the plan.  [place], DO 08/27/19 9:23 AM

## 2019-09-10 ENCOUNTER — Encounter: Payer: Self-pay | Admitting: Family Medicine

## 2019-09-12 MED ORDER — AMITRIPTYLINE HCL 10 MG PO TABS: 10.0000 mg | ORAL_TABLET | Freq: Every day | ORAL | 3 refills | Status: DC

## 2019-10-30 ENCOUNTER — Encounter: Payer: Self-pay | Admitting: Family Medicine

## 2019-11-09 ENCOUNTER — Other Ambulatory Visit: Payer: Self-pay

## 2019-11-09 ENCOUNTER — Encounter: Payer: Self-pay | Admitting: Family Medicine

## 2019-11-09 ENCOUNTER — Ambulatory Visit (INDEPENDENT_AMBULATORY_CARE_PROVIDER_SITE_OTHER): Admitting: Family Medicine

## 2019-11-09 VITALS — BP 122/76 | HR 73 | Temp 96.4°F | Ht 67.0 in | Wt 206.4 lb

## 2019-11-09 DIAGNOSIS — Z111 Encounter for screening for respiratory tuberculosis: Secondary | ICD-10-CM

## 2019-11-09 DIAGNOSIS — J302 Other seasonal allergic rhinitis: Secondary | ICD-10-CM

## 2019-11-09 DIAGNOSIS — K589 Irritable bowel syndrome without diarrhea: Secondary | ICD-10-CM | POA: Diagnosis not present

## 2019-11-09 MED ORDER — LEVOCETIRIZINE DIHYDROCHLORIDE 5 MG PO TABS: 5.0000 mg | ORAL_TABLET | Freq: Every evening | ORAL | 5 refills | Status: DC

## 2019-11-09 NOTE — Progress Notes (Signed)
Chief Complaint  Patient presents with  . Follow-up    IBS    Subjective: Patient is a 34 y.o. female here for f/u IBS.  Started on Elavil 10 mg/d. Remembering to take most of the time. No AE's. Reports about 70% improvement. Does not wish to change dosage at this time. Pleased with results.   Hx of hives over past week. Might be allergic to pollen. No URI s/s's. No swelling. Has been taking Zyrtec, but it makes her drowsy.  Needs test for Tb for school/work.   ROS: Const: no fevers GI: No pain  Past Medical History:  Diagnosis Date  . Anemia     Objective: BP 122/76 (BP Location: Left Arm, Patient Position: Sitting, Cuff Size: Normal)   Pulse 73   Temp (!) 96.4 F (35.8 C) (Temporal)   Ht 5\' 7"  (1.702 m)   Wt 206 lb 6 oz (93.6 kg)   SpO2 98%   BMI 32.32 kg/m  General: Awake, appears stated age Abd: S, NT, ND, no masses or organomegaly, BS hypoactive Heart: RRR, no murmurs Lungs: CTAB, no rales, wheezes or rhonchi. No accessory muscle use Psych: Age appropriate judgment and insight, normal affect and mood  Assessment and Plan: Irritable bowel syndrome, unspecified type  Seasonal allergies - Plan: levocetirizine (XYZAL) 5 MG tablet  Screening-pulmonary TB - Plan: QuantiFERON-TB Gold Plus  1- Cont Elavil 10 mg/d. She finishes her clinicals in 8 weeks, will consider stopping cold at that time.  2- Trial Xyzal instead of Zyrtec to see if it makes her less drowsy. Consider Allegra if it does make her drowsy. 3- Ck Quant gold.  F/u in 6 mo for CPE if she is still in area.  The patient voiced understanding and agreement to the plan.  [place] Sully, DO 11/09/19  8:02 AM

## 2019-11-09 NOTE — Patient Instructions (Addendum)
Keep the diet clean and stay active.  Keep an eye on your triggers. OK to stop taking the medicine when you wrap up with your school.   Let us know if you need anything.

## 2019-11-14 LAB — QUANTIFERON-TB GOLD PLUS
Mitogen-NIL: 8.01 IU/mL
NIL: 0.03 IU/mL
QuantiFERON-TB Gold Plus: NEGATIVE
TB1-NIL: 0.05 IU/mL
TB2-NIL: 0.08 IU/mL

## 2020-03-28 ENCOUNTER — Encounter: Payer: Self-pay | Admitting: Family Medicine

## 2020-03-28 ENCOUNTER — Other Ambulatory Visit: Payer: Self-pay

## 2020-03-28 ENCOUNTER — Ambulatory Visit (INDEPENDENT_AMBULATORY_CARE_PROVIDER_SITE_OTHER): Admitting: Family Medicine

## 2020-03-28 VITALS — BP 110/72 | HR 92 | Temp 98.4°F | Ht 67.0 in | Wt 212.2 lb

## 2020-03-28 DIAGNOSIS — M545 Low back pain, unspecified: Secondary | ICD-10-CM

## 2020-03-28 DIAGNOSIS — R2231 Localized swelling, mass and lump, right upper limb: Secondary | ICD-10-CM

## 2020-03-28 NOTE — Patient Instructions (Signed)
Ibuprofen 400-600 mg (2-3 over the counter strength tabs) every 6 hours as needed for pain.  OK to take Tylenol 1000 mg (2 extra strength tabs) or 975 mg (3 regular strength tabs) every 6 hours as needed.  Ice/cold pack over area for 10-15 min twice daily.  Heat (pad or rice pillow in microwave) over affected area, 10-15 minutes twice daily.   Let me know if you want to go through PT.  Someone will reach out regarding your [place].  Let us know if you need anything.  EXERCISES  RANGE OF MOTION (ROM) AND STRETCHING EXERCISES - Low Back Pain Most people with lower back pain will find that their symptoms get worse with excessive bending forward (flexion) or arching at the lower back (extension). The exercises that will help resolve your symptoms will focus on the opposite motion.  If you have pain, numbness or tingling which travels down into your buttocks, leg or foot, the goal of the therapy is for these symptoms to move closer to your back and eventually resolve. Sometimes, these leg symptoms will get better, but your lower back pain may worsen. This is often an indication of progress in your rehabilitation. Be very alert to any changes in your symptoms and the activities in which you participated in the 24 hours prior to the change. Sharing this information with your caregiver will allow him or her to most efficiently treat your condition. These exercises may help you when beginning to rehabilitate your injury. Your symptoms may resolve with or without further involvement from your physician, physical therapist or athletic trainer. While completing these exercises, remember:   Restoring tissue flexibility helps normal motion to return to the joints. This allows healthier, less painful movement and activity.  An effective stretch should be held for at least 30 seconds.  A stretch should never be painful. You should only feel a gentle lengthening or release in the stretched tissue. FLEXION RANGE  OF MOTION AND STRETCHING EXERCISES:  STRETCH - Flexion, Single Knee to Chest   Lie on a firm bed or floor with both legs extended in front of you.  Keeping one leg in contact with the floor, bring your opposite knee to your chest. Hold your leg in place by either grabbing behind your thigh or at your knee.  Pull until you feel a gentle stretch in your low back. Hold 30 seconds.  Slowly release your grasp and repeat the exercise with the opposite side. Repeat 2 times. Complete this exercise 3 times per week.   STRETCH - Flexion, Double Knee to Chest  Lie on a firm bed or floor with both legs extended in front of you.  Keeping one leg in contact with the floor, bring your opposite knee to your chest.  Tense your stomach muscles to support your back and then lift your other knee to your chest. Hold your legs in place by either grabbing behind your thighs or at your knees.  Pull both knees toward your chest until you feel a gentle stretch in your low back. Hold 30 seconds.  Tense your stomach muscles and slowly return one leg at a time to the floor. Repeat 2 times. Complete this exercise 3 times per week.   STRETCH - Low Trunk Rotation  Lie on a firm bed or floor. Keeping your legs in front of you, bend your knees so they are both pointed toward the ceiling and your feet are flat on the floor.  Extend your arms out to  the side. This will stabilize your upper body by keeping your shoulders in contact with the floor.  Gently and slowly drop both knees together to one side until you feel a gentle stretch in your low back. Hold for 30 seconds.  Tense your stomach muscles to support your lower back as you bring your knees back to the starting position. Repeat the exercise to the other side. Repeat 2 times. Complete this exercise at least 3 times per week.   EXTENSION RANGE OF MOTION AND FLEXIBILITY EXERCISES:  STRETCH - Extension, Prone on Elbows   Lie on your stomach on the floor, a  bed will be too soft. Place your palms about shoulder width apart and at the height of your head.  Place your elbows under your shoulders. If this is too painful, stack pillows under your chest.  Allow your body to relax so that your hips drop lower and make contact more completely with the floor.  Hold this position for 30 seconds.  Slowly return to lying flat on the floor. Repeat 2 times. Complete this exercise 3 times per week.   RANGE OF MOTION - Extension, Prone Press Ups  Lie on your stomach on the floor, a bed will be too soft. Place your palms about shoulder width apart and at the height of your head.  Keeping your back as relaxed as possible, slowly straighten your elbows while keeping your hips on the floor. You may adjust the placement of your hands to maximize your comfort. As you gain motion, your hands will come more underneath your shoulders.  Hold this position 30 seconds.  Slowly return to lying flat on the floor. Repeat 2 times. Complete this exercise 3 times per week.   RANGE OF MOTION- Quadruped, Neutral Spine   Assume a hands and knees position on a firm surface. Keep your hands under your shoulders and your knees under your hips. You may place padding under your knees for comfort.  Drop your head and point your tailbone toward the ground below you. This will round out your lower back like an angry cat. Hold this position for 30 seconds.  Slowly lift your head and release your tail bone so that your back sags into a large arch, like an old horse.  Hold this position for 30 seconds.  Repeat this until you feel limber in your low back.  Now, find your "sweet spot." This will be the most comfortable position somewhere between the two previous positions. This is your neutral spine. Once you have found this position, tense your stomach muscles to support your low back.  Hold this position for 30 seconds. Repeat 2 times. Complete this exercise 3 times per week.    STRENGTHENING EXERCISES - Low Back Sprain These exercises may help you when beginning to rehabilitate your injury. These exercises should be done near your "sweet spot." This is the neutral, low-back arch, somewhere between fully rounded and fully arched, that is your least painful position. When performed in this safe range of motion, these exercises can be used for people who have either a flexion or extension based injury. These exercises may resolve your symptoms with or without further involvement from your physician, physical therapist or athletic trainer. While completing these exercises, remember:   Muscles can gain both the endurance and the strength needed for everyday activities through controlled exercises.  Complete these exercises as instructed by your physician, physical therapist or athletic trainer. Increase the resistance and repetitions only as guided.  You may experience muscle soreness or fatigue, but the pain or discomfort you are trying to eliminate should never worsen during these exercises. If this pain does worsen, stop and make certain you are following the directions exactly. If the pain is still present after adjustments, discontinue the exercise until you can discuss the trouble with your caregiver.  STRENGTHENING - Deep Abdominals, Pelvic Tilt   Lie on a firm bed or floor. Keeping your legs in front of you, bend your knees so they are both pointed toward the ceiling and your feet are flat on the floor.  Tense your lower abdominal muscles to press your low back into the floor. This motion will rotate your pelvis so that your tail bone is scooping upwards rather than pointing at your feet or into the floor. With a gentle tension and even breathing, hold this position for 3 seconds. Repeat 2 times. Complete this exercise 3 times per week.   STRENGTHENING - Abdominals, Crunches   Lie on a firm bed or floor. Keeping your legs in front of you, bend your knees so they  are both pointed toward the ceiling and your feet are flat on the floor. Cross your arms over your chest.  Slightly tip your chin down without bending your neck.  Tense your abdominals and slowly lift your trunk high enough to just clear your shoulder blades. Lifting higher can put excessive stress on the lower back and does not further strengthen your abdominal muscles.  Control your return to the starting position. Repeat 2 times. Complete this exercise 3 times per week.   STRENGTHENING - Quadruped, Opposite UE/LE Lift   Assume a hands and knees position on a firm surface. Keep your hands under your shoulders and your knees under your hips. You may place padding under your knees for comfort.  Find your neutral spine and gently tense your abdominal muscles so that you can maintain this position. Your shoulders and hips should form a rectangle that is parallel with the floor and is not twisted.  Keeping your trunk steady, lift your right hand no higher than your shoulder and then your left leg no higher than your hip. Make sure you are not holding your breath. Hold this position for 30 seconds.  Continuing to keep your abdominal muscles tense and your back steady, slowly return to your starting position. Repeat with the opposite arm and leg. Repeat 2 times. Complete this exercise 3 times per week.   STRENGTHENING - Abdominals and Quadriceps, Straight Leg Raise   Lie on a firm bed or floor with both legs extended in front of you.  Keeping one leg in contact with the floor, bend the other knee so that your foot can rest flat on the floor.  Find your neutral spine, and tense your abdominal muscles to maintain your spinal position throughout the exercise.  Slowly lift your straight leg off the floor about 6 inches for a count of 3, making sure to not hold your breath.  Still keeping your neutral spine, slowly lower your leg all the way to the floor. Repeat this exercise with each leg 2  times. Complete this exercise 3 times per week.  POSTURE AND BODY MECHANICS CONSIDERATIONS - Low Back Sprain Keeping correct posture when sitting, standing or completing your activities will reduce the stress put on different body tissues, allowing injured tissues a chance to heal and limiting painful experiences. The following are general guidelines for improved posture.  While reading these guidelines, remember:  The exercises prescribed by your provider will help you have the flexibility and strength to maintain correct postures.  The correct posture provides the best environment for your joints to work. All of your joints have less wear and tear when properly supported by a spine with good posture. This means you will experience a healthier, less painful body.  Correct posture must be practiced with all of your activities, especially prolonged sitting and standing. Correct posture is as important when doing repetitive low-stress activities (typing) as it is when doing a single heavy-load activity (lifting).  RESTING POSITIONS Consider which positions are most painful for you when choosing a resting position. If you have pain with flexion-based activities (sitting, bending, stooping, squatting), choose a position that allows you to rest in a less flexed posture. You would want to avoid curling into a fetal position on your side. If your pain worsens with extension-based activities (prolonged standing, working overhead), avoid resting in an extended position such as sleeping on your stomach. Most people will find more comfort when they rest with their spine in a more neutral position, neither too rounded nor too arched. Lying on a non-sagging bed on your side with a pillow between your knees, or on your back with a pillow under your knees will often provide some relief. Keep in mind, being in any one position for a prolonged period of time, no matter how correct your posture, can still lead to  stiffness.  PROPER SITTING POSTURE In order to minimize stress and discomfort on your spine, you must sit with correct posture. Sitting with good posture should be effortless for a healthy body. Returning to good posture is a gradual process. Many people can work toward this most comfortably by using various supports until they have the flexibility and strength to maintain this posture on their own. When sitting with proper posture, your ears will fall over your shoulders and your shoulders will fall over your hips. You should use the back of the chair to support your upper back. Your lower back will be in a neutral position, just slightly arched. You may place a small pillow or folded towel at the base of your lower back for  support.  When working at a desk, create an environment that supports good, upright posture. Without extra support, muscles tire, which leads to excessive strain on joints and other tissues. Keep these recommendations in mind:  CHAIR:  A chair should be able to slide under your desk when your back makes contact with the back of the chair. This allows you to work closely.  The chair's height should allow your eyes to be level with the upper part of your monitor and your hands to be slightly lower than your elbows.  BODY POSITION  Your feet should make contact with the floor. If this is not possible, use a foot rest.  Keep your ears over your shoulders. This will reduce stress on your neck and low back.  INCORRECT SITTING POSTURES  If you are feeling tired and unable to assume a healthy sitting posture, do not slouch or slump. This puts excessive strain on your back tissues, causing more damage and pain. Healthier options include:  Using more support, like a lumbar pillow.  Switching tasks to something that requires you to be upright or walking.  Talking a brief walk.  Lying down to rest in a neutral-spine position.  PROLONGED STANDING WHILE SLIGHTLY LEANING  FORWARD  When completing a task that requires you to  lean forward while standing in one place for a long time, place either foot up on a stationary 2-4 inch high object to help maintain the best posture. When both feet are on the ground, the lower back tends to lose its slight inward curve. If this curve flattens (or becomes too large), then the back and your other joints will experience too much stress, tire more quickly, and can cause pain.  CORRECT STANDING POSTURES Proper standing posture should be assumed with all daily activities, even if they only take a few moments, like when brushing your teeth. As in sitting, your ears should fall over your shoulders and your shoulders should fall over your hips. You should keep a slight tension in your abdominal muscles to brace your spine. Your tailbone should point down to the ground, not behind your body, resulting in an over-extended swayback posture.   INCORRECT STANDING POSTURES  Common incorrect standing postures include a forward head, locked knees and/or an excessive swayback. WALKING Walk with an upright posture. Your ears, shoulders and hips should all line-up.  PROLONGED ACTIVITY IN A FLEXED POSITION When completing a task that requires you to bend forward at your waist or lean over a low surface, try to find a way to stabilize 3 out of 4 of your limbs. You can place a hand or elbow on your thigh or rest a knee on the surface you are reaching across. This will provide you more stability, so that your muscles do not tire as quickly. By keeping your knees relaxed, or slightly bent, you will also reduce stress across your lower back. CORRECT LIFTING TECHNIQUES  DO :  Assume a wide stance. This will provide you more stability and the opportunity to get as close as possible to the object which you are lifting.  Tense your abdominals to brace your spine. Bend at the knees and hips. Keeping your back locked in a neutral-spine position, lift using  your leg muscles. Lift with your legs, keeping your back straight.  Test the weight of unknown objects before attempting to lift them.  Try to keep your elbows locked down at your sides in order get the best strength from your shoulders when carrying an object.     Always ask for help when lifting heavy or awkward objects. INCORRECT LIFTING TECHNIQUES DO NOT:   Lock your knees when lifting, even if it is a small object.  Bend and twist. Pivot at your feet or move your feet when needing to change directions.  Assume that you can safely pick up even a paperclip without proper posture.

## 2020-03-28 NOTE — Progress Notes (Signed)
Chief Complaint  Patient presents with  . knot under right arm pit  . Back Pain    Kiara Norman is a 34 y.o. female here for a skin complaint.  Duration: 2 weeks Location: R armpit Pruritic? No Painful? No Drainage? No New soaps/lotions/topicals/detergents? No Sick contacts? No Other associated symptoms: none Therapies tried thus far: none  B/l mid-low back pain and lower L back pain since she had increased physical requirements over past 1-2 weeks. Had a heavier lift day and the next morning, had more pain. She is having intermittent numbness in her L 1st 2 toes. No weakness.   Past Medical History:  Diagnosis Date  . Anemia     BP 110/72 (BP Location: Left Arm, Patient Position: Sitting, Cuff Size: Normal)   Pulse 92   Temp 98.4 F (36.9 C) (Oral)   Ht 5\' 7"  (1.702 m)   Wt (!) 212 lb 4 oz (96.3 kg)   SpO2 98%   BMI 33.24 kg/m  Gen: awake, alert, appearing stated age Lungs: No accessory muscle use Skin: 1.1 x 0.8. No drainage, erythema, TTP, fluctuance, excoriation MSK: +TTP over thoraco-lumbar parasp msc b/l, +TTP over L SI jt; decent hamstring ROM Neuro: DTR's equal and symmetric throughout, no clonus, no cerebellar signs, gait nml Psych: Age appropriate judgment and insight  Lump of axilla, right - Plan: AXILLA RIGHT  Acute bilateral low back pain without sciatica  1. Cyst vs LN. Ck [place].  2. Stretches/exercises. Consider PT. Heat, ice, Tylenol. Declines medication at this time.  F/u prn. The patient voiced understanding and agreement to the plan.  [place], DO 03/28/20 2:09 PM

## 2020-04-01 ENCOUNTER — Other Ambulatory Visit: Payer: Self-pay | Admitting: Family Medicine

## 2020-04-01 DIAGNOSIS — M545 Low back pain, unspecified: Secondary | ICD-10-CM

## 2020-04-01 MED ORDER — CYCLOBENZAPRINE HCL 10 MG PO TABS: 5.0000 mg | ORAL_TABLET | Freq: Three times a day (TID) | ORAL | 0 refills | Status: DC | PRN

## 2020-04-01 NOTE — Progress Notes (Signed)
PT

## 2020-05-02 ENCOUNTER — Encounter: Payer: Self-pay | Admitting: Family Medicine

## 2020-05-14 ENCOUNTER — Encounter: Admitting: Family Medicine

## 2020-08-19 DIAGNOSIS — F32A Depression, unspecified: Secondary | ICD-10-CM | POA: Insufficient documentation

## 2021-03-05 DIAGNOSIS — M25511 Pain in right shoulder: Secondary | ICD-10-CM | POA: Insufficient documentation

## 2021-05-28 DIAGNOSIS — M545 Low back pain, unspecified: Secondary | ICD-10-CM | POA: Insufficient documentation

## 2021-06-18 DIAGNOSIS — Z9182 Personal history of military deployment: Secondary | ICD-10-CM | POA: Insufficient documentation

## 2021-06-18 DIAGNOSIS — Z0289 Encounter for other administrative examinations: Secondary | ICD-10-CM | POA: Insufficient documentation

## 2021-08-03 ENCOUNTER — Ambulatory Visit (INDEPENDENT_AMBULATORY_CARE_PROVIDER_SITE_OTHER): Admitting: Family Medicine

## 2021-08-03 ENCOUNTER — Encounter: Payer: Self-pay | Admitting: Family Medicine

## 2021-08-03 VITALS — BP 108/70 | HR 82 | Temp 98.2°F | Ht 67.0 in | Wt 218.4 lb

## 2021-08-03 DIAGNOSIS — Z0000 Encounter for general adult medical examination without abnormal findings: Secondary | ICD-10-CM | POA: Diagnosis not present

## 2021-08-03 LAB — COMPREHENSIVE METABOLIC PANEL
ALT: 29 U/L (ref 0–35)
AST: 34 U/L (ref 0–37)
Albumin: 4 g/dL (ref 3.5–5.2)
Alkaline Phosphatase: 41 U/L (ref 39–117)
BUN: 16 mg/dL (ref 6–23)
CO2: 26 mEq/L (ref 19–32)
Calcium: 9.3 mg/dL (ref 8.4–10.5)
Chloride: 105 mEq/L (ref 96–112)
Creatinine, Ser: 0.96 mg/dL (ref 0.40–1.20)
GFR: 77.77 mL/min (ref >60.00)
Glucose, Bld: 87 mg/dL (ref 70–99)
Potassium: 3.9 mEq/L (ref 3.5–5.1)
Sodium: 140 mEq/L (ref 135–145)
Total Bilirubin: 0.3 mg/dL (ref 0.2–1.2)
Total Protein: 6.5 g/dL (ref 6.0–8.3)

## 2021-08-03 LAB — LIPID PANEL
Cholesterol: 186 mg/dL (ref 0–200)
HDL: 58.1 mg/dL (ref >39.00)
LDL Cholesterol: 99 mg/dL (ref 0–99)
NonHDL: 127.87
Total CHOL/HDL Ratio: 3
Triglycerides: 146 mg/dL (ref 0.0–149.0)
VLDL: 29.2 mg/dL (ref 0.0–40.0)

## 2021-08-03 LAB — CBC
HCT: 37 % (ref 36.0–46.0)
Hemoglobin: 12.2 g/dL (ref 12.0–15.0)
MCHC: 32.9 g/dL (ref 30.0–36.0)
MCV: 92.4 fl (ref 78.0–100.0)
Platelets: 274 10*3/uL (ref 150.0–400.0)
RBC: 4.01 Mil/uL (ref 3.87–5.11)
RDW: 13.5 % (ref 11.5–15.5)
WBC: 7.2 10*3/uL (ref 4.0–10.5)

## 2021-08-03 MED ORDER — NORGESTIMATE-ETH ESTRADIOL 0.25-35 MG-MCG PO TABS: 1.0000 | ORAL_TABLET | Freq: Every day | ORAL | 11 refills | Status: DC

## 2021-08-03 NOTE — Progress Notes (Signed)
Chief Complaint  Patient presents with   Annual Exam     Well Woman Kiara Norman is here for a complete physical.   Her last physical was >1 year ago.  Current diet: in general, a "pretty good" diet. Current exercise: lifting, some cardio. Contraception? Yes Fatigue out of ordinary? No Seatbelt? Yes  Health Maintenance Pap/HPV- Yes Tetanus- Yes HIV screening- Yes Hep C screening- Yes  Past Medical History:  Diagnosis Date   Anemia      Past Surgical History:  Procedure Laterality Date   CESAREAN SECTION     x2    Medications  Takes no meds routinely.    Allergies No Known Allergies  Review of Systems: Constitutional:  no unexpected weight changes Eye:  no recent significant change in vision Ear/Nose/Mouth/Throat:  Ears:  no tinnitus or vertigo and no recent change in hearing Nose/Mouth/Throat:  no complaints of nasal congestion, no sore throat Cardiovascular: no chest pain Respiratory:  no cough and no shortness of breath Gastrointestinal:  no abdominal pain, no change in bowel habits GU:  Female: negative for dysuria or pelvic pain Musculoskeletal/Extremities:  no new pain of the joints Integumentary (Skin/Breast):  no abnormal skin lesions reported Neurologic:  no headaches Endocrine:  denies fatigue Hematologic/Lymphatic:  No areas of easy bleeding  Exam BP 108/70   Pulse 82   Temp 98.2 F (36.8 C) (Oral)   Ht 5\' 7"  (1.702 m)   Wt 218 lb 6 oz (99.1 kg)   SpO2 99%   BMI 34.20 kg/m  General:  well developed, well nourished, in no apparent distress Skin:  no significant moles, warts, or growths Head:  no masses, lesions, or tenderness Eyes:  pupils equal and round, sclera anicteric without injection Ears:  canals without lesions, TMs shiny without retraction, no obvious effusion, no erythema Nose:  nares patent, septum midline, mucosa normal, and no drainage or sinus tenderness Throat/Pharynx:  lips and gingiva without lesion; tongue and uvula  midline; non-inflamed pharynx; no exudates or postnasal drainage Neck: neck supple without adenopathy, thyromegaly, or masses Lungs:  clear to auscultation, breath sounds equal bilaterally, no respiratory distress Cardio:  regular rate and rhythm, no bruits, no LE edema Abdomen:  abdomen soft, nontender; bowel sounds normal; no masses or organomegaly Genital: Defer to GYN Musculoskeletal:  symmetrical muscle groups noted without atrophy or deformity Extremities:  no clubbing, cyanosis, or edema, no deformities, no skin discoloration Neuro:  gait normal; deep tendon reflexes normal and symmetric Psych: well oriented with normal range of affect and appropriate judgment/insight  Assessment and Plan  Well adult exam - Plan: CBC, Comprehensive metabolic panel, Lipid panel   Well 35 y.o. female. Counseled on diet and exercise. Other orders as above. Follow up in 1 yr or prn. The patient voiced understanding and agreement to the plan.  [address] [place], DO 08/03/21 2:52 PM

## 2021-08-03 NOTE — Patient Instructions (Addendum)
Give us 2-3 business days to get the results of your labs back.   Keep the diet clean and stay active.  I recommend getting the updated bivalent covid vaccination booster at your convenience.   Let us know if you need anything. 

## 2021-08-27 ENCOUNTER — Encounter: Payer: Self-pay | Admitting: Family Medicine

## 2021-08-28 ENCOUNTER — Other Ambulatory Visit: Payer: Self-pay | Admitting: Family Medicine

## 2021-08-28 DIAGNOSIS — Z0184 Encounter for antibody response examination: Secondary | ICD-10-CM

## 2021-09-11 ENCOUNTER — Other Ambulatory Visit

## 2021-09-15 ENCOUNTER — Other Ambulatory Visit (INDEPENDENT_AMBULATORY_CARE_PROVIDER_SITE_OTHER)

## 2021-09-15 DIAGNOSIS — Z0184 Encounter for antibody response examination: Secondary | ICD-10-CM | POA: Diagnosis not present

## 2021-09-16 LAB — MEASLES/MUMPS/RUBELLA IMMUNITY
Mumps IgG: >300 AU/mL
Rubella: 6.46 Index
Rubeola IgG: >300 AU/mL

## 2021-09-16 LAB — HEPATITIS B SURFACE ANTIBODY,QUALITATIVE: Hep B S Ab: REACTIVE — AB

## 2021-09-16 LAB — VARICELLA ZOSTER ANTIBODY, IGG: Varicella IgG: 3193 index

## 2021-10-02 ENCOUNTER — Other Ambulatory Visit (INDEPENDENT_AMBULATORY_CARE_PROVIDER_SITE_OTHER)

## 2021-10-02 ENCOUNTER — Telehealth: Payer: Self-pay

## 2021-10-02 ENCOUNTER — Other Ambulatory Visit: Payer: Self-pay | Admitting: Family Medicine

## 2021-10-02 DIAGNOSIS — Z111 Encounter for screening for respiratory tuberculosis: Secondary | ICD-10-CM | POA: Diagnosis not present

## 2021-10-02 NOTE — Telephone Encounter (Signed)
[facility] Night - Client Client Site [facility] - Night Provider Arva Chafe- MD Contact Type Call Who Is Calling Patient / Member / Family / Caregiver Caller Name loren vicens Caller Phone Number [phone] Patient Name Kiara Norman Patient DOB [birth year] Call Type Message Only Information Provided Reason for Call Request for General Office Information Initial Comment Caller states, needs TB lab work done today. 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 Needs another lab request. Additional Comment Caller states, please call back. Needs labs drawn today. Disp. Time Disposition Final User 10/02/2021 7:53:57 AM General Information Provided Yes Merrily Pew Call Closed By: Merrily Pew Transaction Date/Time: 10/02/2021 7:50:49 AM (ET

## 2021-10-02 NOTE — Telephone Encounter (Signed)
Put in order Called the patient scheduled lab appt.

## 2021-10-07 LAB — QUANTIFERON-TB GOLD PLUS
Mitogen-NIL: >10 IU/mL
NIL: 0.02 IU/mL
QuantiFERON-TB Gold Plus: NEGATIVE
TB1-NIL: 0.02 IU/mL
TB2-NIL: 0.04 IU/mL

## 2021-11-23 ENCOUNTER — Encounter: Payer: Self-pay | Admitting: Family Medicine

## 2021-11-24 ENCOUNTER — Encounter: Payer: Self-pay | Admitting: Family Medicine

## 2021-11-24 ENCOUNTER — Ambulatory Visit (INDEPENDENT_AMBULATORY_CARE_PROVIDER_SITE_OTHER): Admitting: Family Medicine

## 2021-11-24 VITALS — BP 118/72 | HR 86 | Temp 98.0°F | Ht 67.0 in | Wt 222.2 lb

## 2021-11-24 DIAGNOSIS — R110 Nausea: Secondary | ICD-10-CM | POA: Diagnosis not present

## 2021-11-24 MED ORDER — PANTOPRAZOLE SODIUM 40 MG PO TBEC
40.0000 mg | DELAYED_RELEASE_TABLET | Freq: Every day | ORAL | 1 refills | Status: AC
Start: 2021-11-24 — End: ?

## 2021-11-24 MED ORDER — ONDANSETRON 4 MG PO TBDP: 4.0000 mg | ORAL_TABLET | Freq: Three times a day (TID) | ORAL | 0 refills | Status: DC | PRN

## 2021-11-24 NOTE — Patient Instructions (Signed)
The only lifestyle changes that have data behind them are weight loss for the overweight/obese and elevating the head of the bed. Finding out which foods/positions are triggers is important. ? ?Send me a message in a few weeks if not improving. ? ?Let us know if you need anything. ?

## 2021-11-24 NOTE — Progress Notes (Signed)
Chief Complaint  ?Patient presents with  ? Nausea  ?  One week ?Blood in stool twice ?Gas and burping ? ?  ? ? ? ?Subjective ?Kiara Norman is a 36 y.o. female who presents with nausea.  ?Symptoms began 1 week ago.  ?Patient has belching, N/V ?Patient denies abdominal pain, cramping, diarrhea, fever, and recent travel, medication change, new foods ?No known triggers.  ?Treatment to date: Ginger, peppermint ?Sick contacts: none known ?Neg pregnancy test at home.  ? ?Past Medical History:  ?Diagnosis Date  ? Anemia   ? ? ?Exam ?BP 118/72   Pulse 86   Temp 98 ?F (36.7 ?C) (Oral)   Ht 5\' 7"  (1.702 m)   Wt 222 lb 4 oz (100.8 kg)   SpO2 98%   BMI 34.81 kg/m?  ?General:  well developed, well hydrated, in no apparent distress ?Skin:  warm, no pallor or diaphoresis, no rashes ?Throat/Pharynx:  lips and gingiva without lesion; tongue and uvula midline; non-inflamed pharynx; no exudates or postnasal drainage ?Lungs:  clear to auscultation, breath sounds equal bilaterally, no respiratory distress, no wheezes ?Cardio:  RRR ?Abdomen:  abdomen soft, nontender; bowel sounds normal; no masses or organomegaly ?Psych: Appropriate judgement/insight ? ?Assessment and Plan ? ?Nausea - Plan: ondansetron (ZOFRAN-ODT) 4 MG disintegrating tablet, pantoprazole (PROTONIX) 40 MG tablet ? ?Zofran prn. PPI to consider underlying reflux. Discussed H pylori testing. Will defer to see how she does given no pain. Avoid aggravating foods/triggers. Will consider GI referral if no improvement.  ?F/u if symptoms fail to improve, sooner if worsening. ?The patient voiced understanding and agreement to the plan. ? ? , DO ?11/24/21  ?3:29 PM ? ?

## 2021-12-08 ENCOUNTER — Other Ambulatory Visit: Payer: Self-pay | Admitting: Family Medicine

## 2021-12-08 DIAGNOSIS — R110 Nausea: Secondary | ICD-10-CM

## 2022-01-07 ENCOUNTER — Encounter: Payer: Self-pay | Admitting: Gastroenterology

## 2022-01-28 ENCOUNTER — Other Ambulatory Visit: Payer: Self-pay

## 2022-02-02 ENCOUNTER — Encounter: Payer: Self-pay | Admitting: Gastroenterology

## 2022-02-02 ENCOUNTER — Ambulatory Visit (INDEPENDENT_AMBULATORY_CARE_PROVIDER_SITE_OTHER): Admitting: Gastroenterology

## 2022-02-02 ENCOUNTER — Other Ambulatory Visit (INDEPENDENT_AMBULATORY_CARE_PROVIDER_SITE_OTHER)

## 2022-02-02 VITALS — BP 110/70 | HR 85 | Ht 67.0 in | Wt 217.0 lb

## 2022-02-02 DIAGNOSIS — R1033 Periumbilical pain: Secondary | ICD-10-CM

## 2022-02-02 DIAGNOSIS — R194 Change in bowel habit: Secondary | ICD-10-CM | POA: Diagnosis not present

## 2022-02-02 DIAGNOSIS — R112 Nausea with vomiting, unspecified: Secondary | ICD-10-CM

## 2022-02-02 DIAGNOSIS — K625 Hemorrhage of anus and rectum: Secondary | ICD-10-CM

## 2022-02-02 LAB — COMPREHENSIVE METABOLIC PANEL
ALT: 35 U/L (ref 0–35)
AST: 23 U/L (ref 0–37)
Albumin: 4.3 g/dL (ref 3.5–5.2)
Alkaline Phosphatase: 47 U/L (ref 39–117)
BUN: 15 mg/dL (ref 6–23)
CO2: 27 mEq/L (ref 19–32)
Calcium: 9.7 mg/dL (ref 8.4–10.5)
Chloride: 103 mEq/L (ref 96–112)
Creatinine, Ser: 0.9 mg/dL (ref 0.40–1.20)
GFR: 83.74 mL/min (ref >60.00)
Glucose, Bld: 99 mg/dL (ref 70–99)
Potassium: 4.6 mEq/L (ref 3.5–5.1)
Sodium: 138 mEq/L (ref 135–145)
Total Bilirubin: 0.5 mg/dL (ref 0.2–1.2)
Total Protein: 7.3 g/dL (ref 6.0–8.3)

## 2022-02-02 LAB — CBC WITH DIFFERENTIAL/PLATELET
Basophils Absolute: 0 10*3/uL (ref 0.0–0.1)
Basophils Relative: 0.4 % (ref 0.0–3.0)
Eosinophils Absolute: 0.1 10*3/uL (ref 0.0–0.7)
Eosinophils Relative: 1.3 % (ref 0.0–5.0)
HCT: 40.2 % (ref 36.0–46.0)
Hemoglobin: 13.5 g/dL (ref 12.0–15.0)
Lymphocytes Relative: 29.8 % (ref 12.0–46.0)
Lymphs Abs: 2 10*3/uL (ref 0.7–4.0)
MCHC: 33.5 g/dL (ref 30.0–36.0)
MCV: 90.6 fl (ref 78.0–100.0)
Monocytes Absolute: 0.4 10*3/uL (ref 0.1–1.0)
Monocytes Relative: 6.3 % (ref 3.0–12.0)
Neutro Abs: 4.2 10*3/uL (ref 1.4–7.7)
Neutrophils Relative %: 62.2 % (ref 43.0–77.0)
Platelets: 284 10*3/uL (ref 150.0–400.0)
RBC: 4.43 Mil/uL (ref 3.87–5.11)
RDW: 12.8 % (ref 11.5–15.5)
WBC: 6.8 10*3/uL (ref 4.0–10.5)

## 2022-02-02 LAB — H. PYLORI ANTIBODY, IGG: H Pylori IgG: NEGATIVE

## 2022-02-02 LAB — HIGH SENSITIVITY CRP: CRP, High Sensitivity: 4.48 mg/L (ref 0.000–5.000)

## 2022-02-02 MED ORDER — PLENVU 140 G PO SOLR: 140.0000 g | ORAL | 0 refills | Status: DC

## 2022-02-02 NOTE — Progress Notes (Signed)
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 She recalls that years ago she saw primary care for some nausea and altered bowel habits that were felt likely IBS from school and other social stresses.  She was prescribed amitriptyline but only took it a few weeks because she did not feel it was helping. Starting a few months ago she developed bandlike periumbilic cramps and a twisting sensation with a change in bowel habits.  She tends mostly to be constipated might go 1 to 3 days between BMs, but then also episodically have loose stool with urgency.  There is been rectal bleeding that has steadily worsened to occur now with nearly every bowel movement.  She has waves of nausea that were worse months ago and for which she received Zofran and a PPI by PCP. She has not identified any clear inconsistent food or other triggers for the symptoms.  ROS:  Review of Systems  Constitutional:  Negative for appetite change and unexpected weight change.  HENT:  Negative for mouth sores and voice change.   Eyes:  Negative for pain and redness.  Respiratory:  Negative for cough and shortness of breath.   Cardiovascular:  Negative for chest pain and palpitations.  Genitourinary:  Negative for dysuria and hematuria.  Musculoskeletal:  Negative for arthralgias and myalgias.  Skin:  Negative for pallor and rash.  Allergic/Immunologic: Positive for environmental allergies.  Neurological:  Negative for weakness and headaches.  Hematological:   Negative for adenopathy.  Psychiatric/Behavioral:         Depression    Past Medical History: Past Medical History:  Diagnosis Date   Anemia      Past Surgical History: Past Surgical History:  Procedure Laterality Date   CESAREAN SECTION     x2     Family History: Family History  Problem Relation Age of Onset   Arthritis Mother    Gastric cancer Maternal Grandmother    Colon cancer Maternal Grandmother    Prostate cancer Maternal Grandfather    Arthritis Paternal Grandfather     Social History: Social History   Socioeconomic History   Marital status: Married    Spouse name: Not on file   Number of children: Not on file   Years of education: Not on file   Highest education level: Not on file  Occupational History   Not on file  Tobacco Use   Smoking status: Never   Smokeless tobacco: Never  Substance and Sexual Activity   Alcohol use: Not Currently   Drug use: Never   Sexual activity: Yes    Birth control/protection: Pill  Other Topics Concern   Not on file  Social History Narrative   Not on file   Social Determinants of Health   Financial Resource Strain: Not on file  Food Insecurity: Not on file  Transportation Needs: Not on file  Physical Activity: Not on file  Stress: Not on file  Social Connections: Not on file    Allergies: No Known Allergies  Outpatient Meds: Current Outpatient Medications  Medication Sig Dispense Refill   Norgestim-Eth Estrad Triphasic (NORGESTIMATE-ETHINYL  ESTRADIOL PO) norgestimate-ethinyl estradiol 0.25 mg-35 mcg oral tablet Start Date: 04/20/20 Status: Ordered     ondansetron (ZOFRAN-ODT) 4 MG disintegrating tablet Take 1 tablet (4 mg total) by mouth every 8 (eight) hours as needed for nausea or vomiting. 30 tablet 0   pantoprazole (PROTONIX) 40 MG tablet Take 1 tablet (40 mg total) by mouth daily. 30 tablet 1   PEG-KCl-NaCl-NaSulf-Na Asc-C (PLENVU) 140 g SOLR Take 140 g by mouth as directed. 1 each 0   No current  facility-administered medications for this visit.      ___________________________________________________________________ Objective   Exam:  BP 110/70   Pulse 85   Ht 5\' 7"  (1.702 m)   Wt 217 lb (98.4 kg)   LMP 01/25/2022 (Approximate)   BMI 33.99 kg/m  Wt Readings from Last 3 Encounters:  02/02/22 217 lb (98.4 kg)  11/24/21 222 lb 4 oz (100.8 kg)  08/03/21 218 lb 6 oz (99.1 kg)    General: Well-appearing, normal vocal quality, normal muscle mass Eyes: sclera anicteric, no redness ENT: oral mucosa moist without lesions, no cervical or supraclavicular lymphadenopathy CV: Regular without murmur, no JVD, no peripheral edema Resp: clear to auscultation bilaterally, normal RR and effort noted GI: soft, midline upper tenderness, with active bowel sounds. No guarding or palpable organomegaly noted. Skin; warm and dry, no rash or jaundice noted Neuro: awake, alert and oriented x 3. Normal gross motor function and fluent speech  Labs:     Latest Ref Rng & Units 02/02/2022    9:53 AM 08/03/2021    2:54 PM 03/06/2018   10:43 AM  CBC  WBC 4.0 - 10.5 K/uL 6.8   7.2   6.5    Hemoglobin 12.0 - 15.0 g/dL 05/07/2018   73.7   10.6    Hematocrit 36.0 - 46.0 % 40.2   37.0   37.2    Platelets 150.0 - 400.0 K/uL 284.0   274.0   255.0        Latest Ref Rng & Units 02/02/2022    9:53 AM 08/03/2021    2:54 PM 04/21/2018    8:45 AM  CMP  Glucose 70 - 99 mg/dL 99   87   91    BUN 6 - 23 mg/dL 15   16   14     Creatinine 0.40 - 1.20 mg/dL 04/23/2018     4.85    Sodium 135 - 145 mEq/L 138   140   138    Potassium 3.5 - 5.1 mEq/L 4.6   3.9   4.5    Chloride 96 - 112 mEq/L 103   105   104    CO2 19 - 32 mEq/L 27   26   29     Calcium 8.4 - 10.5 mg/dL 9.7   9.3   9.5    Total Protein 6.0 - 8.3 g/dL 7.3   6.5   6.9    Total Bilirubin 0.2 - 1.2 mg/dL 0.5   0.3   0.5    Alkaline Phos 39 - 117 U/L 47   41   38    AST 0 - 37 U/L 23   34   22    ALT 0 - 35 U/L 35   29   21       Radiologic Studies:  No  abdominal imaging  Assessment: Encounter Diagnoses  Name Primary?   Periumbilical abdominal pain Yes   Altered bowel habits    Nausea and vomiting in  adult    Rectal bleeding     Somewhat difficult to characterize symptoms as single digestive disorder.  May be elements of maldigestion, IBS, consider H. pylori, celiac sprue, IBD.  Plan: Labs today: CBC with differential, CMP, H. pylori antibody and celiac antibody  EGD and colonoscopy.  She was agreeable after discussion of procedure and risks.  The benefits and risks of the planned procedure were described in detail with the patient or (when appropriate) their health care proxy.  Risks were outlined as including, but not limited to, bleeding, infection, perforation, adverse medication reaction leading to cardiac or pulmonary decompensation, pancreatitis (if ERCP).  The limitation of incomplete mucosal visualization was also discussed.  No guarantees or warranties were given.   If unrevealing, abdominal imaging  Thank you for the courtesy of this consult.  Please call me with any questions or concerns.  Charlie Pitter III  CC: Referring provider noted above

## 2022-02-02 NOTE — Patient Instructions (Signed)
If you are age 34 or older, your body mass index should be between 23-30. Your Body mass index is 33.99 kg/m. If this is out of the aforementioned range listed, please consider follow up with your Primary Care Provider.  If you are age 60 or younger, your body mass index should be between 19-25. Your Body mass index is 33.99 kg/m. If this is out of the aformentioned range listed, please consider follow up with your Primary Care Provider.   ________________________________________________________  The [facility] providers would like to encourage you to use [facility] to communicate with providers for non-urgent requests or questions.  Due to long hold times on the telephone, sending your provider a message by [facility] may be a faster and more efficient way to get a response.  Please allow 48 business hours for a response.  Please remember that this is for non-urgent requests.  _______________________________________________________  Kiara Norman have been scheduled for an endoscopy and colonoscopy. Please follow the written instructions given to you at your visit today. Please pick up your prep supplies at the pharmacy within the next 1-3 days. If you use inhalers (even only as needed), please bring them with you on the day of your procedure.  Your provider has requested that you go to the basement level for lab work before leaving today. Press "B" on the elevator. The lab is located at the first door on the left as you exit the elevator.  Due to recent changes in healthcare laws, you may see the results of your imaging and laboratory studies on MyChart before your provider has had a chance to review them.  We understand that in some cases there may be results that are confusing or concerning to you. Not all laboratory results come back in the same time frame and the provider may be waiting for multiple results in order to interpret others.  Please give [place] 48 hours in order for your provider to thoroughly review  all the results before contacting the office for clarification of your results.   It was a pleasure to see you today!  Thank you for trusting me with your gastrointestinal care!

## 2022-02-21 ENCOUNTER — Encounter: Payer: Self-pay | Admitting: Certified Registered Nurse Anesthetist

## 2022-03-03 ENCOUNTER — Encounter: Payer: Self-pay | Admitting: Gastroenterology

## 2022-03-03 NOTE — Telephone Encounter (Signed)
Called and spoke with patient. Pt's appt has been rescheduled to Friday, 04/16/22 at 2:00 pm. Pt is aware that she will need to arrive at 1 pm with a care partner. Pt states that she is able to adjust her instructions to fit new appt date and time. Pt had no other concerns at the end of the call.

## 2022-03-05 ENCOUNTER — Encounter: Admitting: Gastroenterology

## 2022-04-16 ENCOUNTER — Encounter: Payer: Self-pay | Admitting: Gastroenterology

## 2022-04-16 ENCOUNTER — Ambulatory Visit (AMBULATORY_SURGERY_CENTER): Admitting: Gastroenterology

## 2022-04-16 VITALS — BP 106/79 | HR 64 | Temp 97.1°F | Resp 8 | Ht 67.0 in | Wt 217.0 lb

## 2022-04-16 DIAGNOSIS — K625 Hemorrhage of anus and rectum: Secondary | ICD-10-CM

## 2022-04-16 DIAGNOSIS — R194 Change in bowel habit: Secondary | ICD-10-CM | POA: Diagnosis not present

## 2022-04-16 DIAGNOSIS — K529 Noninfective gastroenteritis and colitis, unspecified: Secondary | ICD-10-CM

## 2022-04-16 DIAGNOSIS — R1033 Periumbilical pain: Secondary | ICD-10-CM | POA: Diagnosis present

## 2022-04-16 DIAGNOSIS — R112 Nausea with vomiting, unspecified: Secondary | ICD-10-CM | POA: Diagnosis not present

## 2022-04-16 DIAGNOSIS — R110 Nausea: Secondary | ICD-10-CM | POA: Diagnosis not present

## 2022-04-16 DIAGNOSIS — K317 Polyp of stomach and duodenum: Secondary | ICD-10-CM

## 2022-04-16 LAB — POCT URINE PREGNANCY: Preg Test, Ur: NEGATIVE

## 2022-04-16 MED ORDER — SODIUM CHLORIDE 0.9 % IV SOLN: 500.0000 mL | Freq: Once | INTRAVENOUS | Status: DC

## 2022-04-16 NOTE — Patient Instructions (Signed)
YOU HAD AN ENDOSCOPIC PROCEDURE TODAY AT THE [facility]:   Refer to the procedure report that was given to you for any specific questions about what was found during the examination.  If the procedure report does not answer your questions, please call your gastroenterologist to clarify.  If you requested that your care partner not be given the details of your procedure findings, then the procedure report has been included in a sealed envelope for you to review at your convenience later.  YOU SHOULD EXPECT: Some feelings of bloating in the abdomen. Passage of more gas than usual.  Walking can help get rid of the air that was put into your GI tract during the procedure and reduce the bloating. If you had a lower endoscopy (such as a colonoscopy or flexible sigmoidoscopy) you may notice spotting of blood in your stool or on the toilet paper. If you underwent a bowel prep for your procedure, you may not have a normal bowel movement for a few days.  Please Note:  You might notice some irritation and congestion in your nose or some drainage.  This is from the oxygen used during your procedure.  There is no need for concern and it should clear up in a day or so.  SYMPTOMS TO REPORT IMMEDIATELY:  Following lower endoscopy (colonoscopy or flexible sigmoidoscopy):  Excessive amounts of blood in the stool  Significant tenderness or worsening of abdominal pains  Swelling of the abdomen that is new, acute  Fever of 100F or higher  Following upper endoscopy (EGD)  Vomiting of blood or coffee ground material  New chest pain or pain under the shoulder blades  Painful or persistently difficult swallowing  New shortness of breath  Fever of 100F or higher  Black, tarry-looking stools  For urgent or emergent issues, a gastroenterologist can be reached at any hour by calling [phone]. Do not use MyChart messaging for urgent concerns.    DIET:  We do recommend a small meal at first, but  then you may proceed to your regular diet.  Drink plenty of fluids but you should avoid alcoholic beverages for 24 hours.  ACTIVITY:  You should plan to take it easy for the rest of today and you should NOT DRIVE or use heavy machinery until tomorrow (because of the sedation medicines used during the test).    FOLLOW UP: Our staff will call the number listed on your records the next business day following your procedure.  We will call around 7:15- 8:00 am to check on you and address any questions or concerns that you may have regarding the information given to you following your procedure. If we do not reach you, we will leave a message.  If you develop any symptoms (ie: fever, flu-like symptoms, shortness of breath, cough etc.) before then, please call [phone].  If you test positive for Covid 19 in the 2 weeks post procedure, please call and report this information to us.    If any biopsies were taken you will be contacted by phone or by letter within the next 1-3 weeks.  Please call us at [phone] if you have not heard about the biopsies in 3 weeks.    SIGNATURES/CONFIDENTIALITY: You and/or your care partner have signed paperwork which will be entered into your electronic medical record.  These signatures attest to the fact that that the information above on your After Visit Summary has been reviewed and is understood.  Full responsibility of the confidentiality   of this discharge information lies with you and/or your care-partner.  

## 2022-04-16 NOTE — Progress Notes (Signed)
History and Physical:  This patient presents for endoscopic testing for: Encounter Diagnoses  Name Primary?   Periumbilical abdominal pain Yes   Rectal bleeding    Altered bowel habits    Nausea and vomiting in adult     36 year old woman seen in the office 02/02/2022 for constellation of GI symptoms including abdominal pain, nausea and altered bowel habits.  She is scheduled for an upper endoscopy and colonoscopy today for further investigation of the symptoms.  Recent labs negative for H. pylori antibody.  Celiac antibodies were planned, though there are no results.  Patient is otherwise without complaints or active issues today.   Past Medical History: Past Medical History:  Diagnosis Date   Anemia    GERD (gastroesophageal reflux disease)      Past Surgical History: Past Surgical History:  Procedure Laterality Date   CESAREAN SECTION     x2   LEEP  2007    Allergies: No Known Allergies  Outpatient Meds: Current Outpatient Medications  Medication Sig Dispense Refill   norgestimate-ethinyl estradiol (ORTHO-CYCLEN, 28,) 0.25-35 MG-MCG tablet      pantoprazole (PROTONIX) 40 MG tablet Take 1 tablet (40 mg total) by mouth daily. 30 tablet 1   ondansetron (ZOFRAN-ODT) 4 MG disintegrating tablet Take 1 tablet (4 mg total) by mouth every 8 (eight) hours as needed for nausea or vomiting. 30 tablet 0   Current Facility-Administered Medications  Medication Dose Route Frequency Provider Last Rate Last Admin   0.9 %  sodium chloride infusion  500 mL Intravenous Once Sherrilyn Rist, MD          ___________________________________________________________________ Objective   Exam:  BP 112/76   Pulse 85   Temp (!) 97.1 F (36.2 C)   Ht 5\' 7"  (1.702 m)   Wt 217 lb (98.4 kg)   SpO2 99%   BMI 33.99 kg/m   CV: RRR without murmur, S1/S2 Resp: clear to auscultation bilaterally, normal RR and effort noted GI: soft, no tenderness, with active bowel  sounds.   Assessment: Encounter Diagnoses  Name Primary?   Periumbilical abdominal pain Yes   Rectal bleeding    Altered bowel habits    Nausea and vomiting in adult      Plan: Colonoscopy EGD  The benefits and risks of the planned procedure were described in detail with the patient or (when appropriate) their health care proxy.  Risks were outlined as including, but not limited to, bleeding, infection, perforation, adverse medication reaction leading to cardiac or pulmonary decompensation, pancreatitis (if ERCP).  The limitation of incomplete mucosal visualization was also discussed.  No guarantees or warranties were given.    The patient is appropriate for an endoscopic procedure in the ambulatory setting.   - , MD

## 2022-04-16 NOTE — Op Note (Signed)
[facility] Patient Name: Kiara Norman Procedure Date: 04/16/2022 1:49 PM MRN: [id number] Endoscopist: Sherilyn Cooter L. Myrtie Neither , MD Age: 36 Referring MD:  Date of Birth: 18-Mar-1986 Gender: Female Account #: [id number] Procedure:                Upper GI endoscopy Indications:              Periumbilical abdominal pain, Nausea                           Negative H pylori antibody Medicines:                Monitored Anesthesia Care Procedure:                Pre-Anesthesia Assessment:                           - Prior to the procedure, a History and Physical                            was performed, and patient medications and                            allergies were reviewed. The patient's tolerance of                            previous anesthesia was also reviewed. The risks                            and benefits of the procedure and the sedation                            options and risks were discussed with the patient.                            All questions were answered, and informed consent                            was obtained. Prior Anticoagulants: The patient has                            taken no previous anticoagulant or antiplatelet                            agents. ASA Grade Assessment: II - A patient with                            mild systemic disease. After reviewing the risks                            and benefits, the patient was deemed in                            satisfactory condition to undergo the procedure.  After obtaining informed consent, the endoscope was                            passed under direct vision. Throughout the                            procedure, the patient's blood pressure, pulse, and                            oxygen saturations were monitored continuously. The                            Endoscope was introduced through the mouth, and                            advanced to the second part of duodenum.  The upper                            GI endoscopy was accomplished without difficulty.                            The patient tolerated the procedure well. Scope In: Scope Out: Findings:                 The esophagus was normal.                           A few sessile fundic gland polyps were found in the                            gastric fundus and in the gastric body.                           The exam of the stomach was otherwise normal.                           The cardia and gastric fundus were normal on                            retroflexion.                           Normal mucosa was found in the entire duodenum.                            Biopsies for histology were taken with a cold                            forceps for evaluation of celiac disease. Complications:            No immediate complications. Estimated Blood Loss:     Estimated blood loss was minimal. Impression:               - Normal esophagus.                           -  A few fundic gland polyps.                           - Normal mucosa was found in the entire examined                            duodenum. Biopsied. Recommendation:           - Patient has a contact number available for                            emergencies. The signs and symptoms of potential                            delayed complications were discussed with the                            patient. Return to normal activities tomorrow.                            Written discharge instructions were provided to the                            patient.                           - Resume previous diet.                           - Continue present medications.                           - Await pathology results.                           - See the other procedure note for documentation of                            additional recommendations.                           - If all Bx negative, trial of antispasmodic                             medicine and consider testing for SIBO. Crystalee Ventress L. Myrtie Neither, MD 04/16/2022 2:27:28 PM This report has been signed electronically.

## 2022-04-16 NOTE — Progress Notes (Signed)
VS by CW  Pt's states no medical or surgical changes since previsit or office visit.  

## 2022-04-16 NOTE — Op Note (Signed)
[facility] Patient Name: Kiara Norman Procedure Date: 04/16/2022 1:57 PM MRN: [id number] Endoscopist: Sherilyn Cooter L. Myrtie Neither , MD Age: 36 Referring MD:  Date of Birth: July 29, 1986 Gender: Female Account #: [id number] Procedure:                Colonoscopy Indications:              Lower abdominal pain, Chronic diarrhea Medicines:                Monitored Anesthesia Care Procedure:                Pre-Anesthesia Assessment:                           - Prior to the procedure, a History and Physical                            was performed, and patient medications and                            allergies were reviewed. The patient's tolerance of                            previous anesthesia was also reviewed. The risks                            and benefits of the procedure and the sedation                            options and risks were discussed with the patient.                            All questions were answered, and informed consent                            was obtained. Prior Anticoagulants: The patient has                            taken no previous anticoagulant or antiplatelet                            agents. ASA Grade Assessment: II - A patient with                            mild systemic disease. After reviewing the risks                            and benefits, the patient was deemed in                            satisfactory condition to undergo the procedure.                           After obtaining informed consent, the colonoscope  was passed under direct vision. Throughout the                            procedure, the patient's blood pressure, pulse, and                            oxygen saturations were monitored continuously. The                            CF [id number] #[id number] was introduced through the anus                            and advanced to the the terminal ileum, with                            identification of the  appendiceal orifice and IC                            valve. The colonoscopy was performed without                            difficulty. The patient tolerated the procedure                            well. The quality of the bowel preparation was                            excellent. The terminal ileum, ileocecal valve,                            appendiceal orifice, and rectum were photographed. Scope In: 2:01:55 PM Scope Out: 2:13:16 PM Scope Withdrawal Time: 0 hours 8 minutes 50 seconds  Total Procedure Duration: 0 hours 11 minutes 21 seconds  Findings:                 The perianal and digital rectal examinations were                            normal.                           The terminal ileum appeared normal.                           Normal mucosa was found in the entire colon.                            Biopsies for histology were taken with a cold                            forceps from the right colon and left colon for                            evaluation of microscopic colitis.  The exam was otherwise without abnormality on                            direct and retroflexion views. Complications:            No immediate complications. Estimated Blood Loss:     Estimated blood loss was minimal. Impression:               - The examined portion of the ileum was normal.                           - Normal mucosa in the entire examined colon.                            Biopsied.                           - The examination was otherwise normal on direct                            and retroflexion views. Recommendation:           - Patient has a contact number available for                            emergencies. The signs and symptoms of potential                            delayed complications were discussed with the                            patient. Return to normal activities tomorrow.                            Written discharge instructions were  provided to the                            patient.                           - Resume previous diet.                           - Continue present medications.                           - Await pathology results.                           - No recommendation at this time regarding repeat                            colonoscopy due to young age.                           - See the other procedure note for documentation of  additional recommendations. Keijuan Schellhase L. Myrtie Neither, MD 04/16/2022 2:15:58 PM This report has been signed electronically.

## 2022-04-16 NOTE — Progress Notes (Signed)
A and O x3. Report to RN. Tolerated MAC anesthesia well.Teeth unchanged after procedure. 

## 2022-04-16 NOTE — Progress Notes (Signed)
Called to room to assist during endoscopic procedure.  Patient ID and intended procedure confirmed with present staff. Received instructions for my participation in the procedure from the performing physician.  

## 2022-04-19 ENCOUNTER — Telehealth: Payer: Self-pay

## 2022-04-19 NOTE — Telephone Encounter (Signed)
Follow up call placed, VM obtained and message left. ?SChaplin, RN,BSN ? ?

## 2022-04-22 ENCOUNTER — Other Ambulatory Visit: Payer: Self-pay

## 2022-04-22 MED ORDER — HYOSCYAMINE SULFATE 0.125 MG PO TABS: 0.1250 mg | ORAL_TABLET | Freq: Two times a day (BID) | ORAL | 1 refills | Status: DC | PRN

## 2022-07-01 ENCOUNTER — Telehealth: Admitting: Family Medicine

## 2022-07-01 DIAGNOSIS — S3992XA Unspecified injury of lower back, initial encounter: Secondary | ICD-10-CM | POA: Diagnosis not present

## 2022-07-01 MED ORDER — CYCLOBENZAPRINE HCL 10 MG PO TABS: 10.0000 mg | ORAL_TABLET | Freq: Three times a day (TID) | ORAL | 0 refills | Status: DC | PRN

## 2022-07-01 MED ORDER — NAPROXEN 500 MG PO TABS: 500.0000 mg | ORAL_TABLET | Freq: Two times a day (BID) | ORAL | 0 refills | Status: DC

## 2022-07-01 NOTE — Progress Notes (Signed)
Virtual Visit Consent   Kiara Norman, you are scheduled for a virtual visit with a [facility] provider today. Just as with appointments in the office, your consent must be obtained to participate. Your consent will be active for this visit and any virtual visit you may have with one of our providers in the next 365 days. If you have a MyChart account, a copy of this consent can be sent to you electronically.  As this is a virtual visit, video technology does not allow for your provider to perform a traditional examination. This may limit your provider's ability to fully assess your condition. If your provider identifies any concerns that need to be evaluated in person or the need to arrange testing (such as labs, EKG, etc.), we will make arrangements to do so. Although advances in technology are sophisticated, we cannot ensure that it will always work on either your end or our end. If the connection with a video visit is poor, the visit may have to be switched to a telephone visit. With either a video or telephone visit, we are not always able to ensure that we have a secure connection.  By engaging in this virtual visit, you consent to the provision of healthcare and authorize for your insurance to be billed (if applicable) for the services provided during this visit. Depending on your insurance coverage, you may receive a charge related to this service.  I need to obtain your verbal consent now. Are you willing to proceed with your visit today? Kiara Norman has provided verbal consent on 07/01/2022 for a virtual visit (video or telephone). Kiara Finner, NP  Date: 07/01/2022 11:02 AM  Virtual Visit via Video Note   I, Kiara Norman, connected with  Kiara Norman  ([id number], 1985/12/04) on 07/01/22 at 11:00 AM EDT by a video-enabled telemedicine application and verified that I am speaking with the correct person using two identifiers.  Location: Patient: Virtual Visit Location Patient:  Home Provider: Virtual Visit Location Provider: Home Office   I discussed the limitations of evaluation and management by telemedicine and the availability of in person appointments. The patient expressed understanding and agreed to proceed.    History of Present Illness: Kiara Norman is a 36 y.o. who identifies as a female who was assigned female at birth, and is being seen today for injury to lower back left side. She was walking downstairs and her son was in front of her- he slipped and she tried to catch him and slipped and felt a twist and spraining sensation. Injury was yesterday 06/30/2022. She is feeling pain left lower back into buttock. Denies sensation changes in perineum area, no loss of bladder or bowel, no radiation into leg or up back. Feels like spasms at times. Has tried 800 mg Motrin without much relief. As well as heating pad.   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   Encounter for other administrative examinations 06/18/2021   Low back pain 05/28/2021   Pain in right shoulder 03/05/2021   Irritable bowel syndrome 11/09/2019   Seasonal allergies 11/09/2019   Hair thinning 03/06/2018   Pain of left hip joint 03/06/2018   Vitamin D deficiency 09/17/2010    Allergies: No Known Allergies Medications:  Current Outpatient Medications:    hyoscyamine (LEVSIN) 0.125 MG tablet, Take 1 tablet (0.125 mg total) by mouth 2 (two) times daily as needed for cramping (or diarrhea)., Disp: 45 tablet,  Rfl: 1   norgestimate-ethinyl estradiol (ORTHO-CYCLEN, 28,) 0.25-35 MG-MCG tablet, , Disp: , Rfl:    ondansetron (ZOFRAN-ODT) 4 MG disintegrating tablet, Take 1 tablet (4 mg total) by mouth every 8 (eight) hours as needed for nausea or vomiting., Disp: 30 tablet, Rfl: 0   pantoprazole (PROTONIX) 40 MG tablet, Take 1 tablet (40 mg total) by mouth daily., Disp: 30 tablet, Rfl: 1  Observations/Objective: Patient is  well-developed, well-nourished in no acute distress.  Resting comfortably  at home.  Head is normocephalic, atraumatic.  No labored breathing.  Speech is clear and coherent with logical content.  Patient is alert and oriented at baseline.  ROM intact   Assessment and Plan: 1. Injury of low back, initial encounter  - naproxen (NAPROSYN) 500 MG tablet; Take 1 tablet (500 mg total) by mouth 2 (two) times daily with a meal.  Dispense: 30 tablet; Refill: 0 - cyclobenzaprine (FLEXERIL) 10 MG tablet; Take 1 tablet (10 mg total) by mouth 3 (three) times daily as needed for muscle spasms.  Dispense: 30 tablet; Refill: 0  -rest -ice for 48 more hours then heat as needed -stretches -follow up with [facility] if not improved for extended time out -no lifting greater than 10 pounds -if sensations change please go to ED to be seen. -no red flags today   Reviewed side effects, risks and benefits of medication.    Patient acknowledged agreement and understanding of the plan.   Past Medical, Surgical, Social History, Allergies, and Medications have been Reviewed.   Follow Up Instructions: I discussed the assessment and treatment plan with the patient. The patient was provided an opportunity to ask questions and all were answered. The patient agreed with the plan and demonstrated an understanding of the instructions.  A copy of instructions were sent to the patient via MyChart unless otherwise noted below.     The patient was advised to call back or seek an in-person evaluation if the symptoms worsen or if the condition fails to improve as anticipated.  Time:  I spent 10 minutes with the patient via telehealth technology discussing the above problems/concerns.    Perlie Mayo, NP

## 2022-07-01 NOTE — Patient Instructions (Signed)
Kiara Norman, thank you for joining Perlie Mayo, NP for today's virtual visit.  While this provider is not your primary care provider (PCP), if your PCP is located in our provider database this encounter information will be shared with them immediately following your visit.   [facility] account gives you access to today's visit and all your visits, tests, and labs performed at [facility] " click here if you don't have a [facility] account or go to [email or web address]  Consent: (Patient) Kiara Norman provided verbal consent for this virtual visit at the beginning of the encounter.  Current Medications:  Current Outpatient Medications:    hyoscyamine (LEVSIN) 0.125 MG tablet, Take 1 tablet (0.125 mg total) by mouth 2 (two) times daily as needed for cramping (or diarrhea)., Disp: 45 tablet, Rfl: 1   norgestimate-ethinyl estradiol (ORTHO-CYCLEN, 28,) 0.25-35 MG-MCG tablet, , Disp: , Rfl:    ondansetron (ZOFRAN-ODT) 4 MG disintegrating tablet, Take 1 tablet (4 mg total) by mouth every 8 (eight) hours as needed for nausea or vomiting., Disp: 30 tablet, Rfl: 0   pantoprazole (PROTONIX) 40 MG tablet, Take 1 tablet (40 mg total) by mouth daily., Disp: 30 tablet, Rfl: 1   Medications ordered in this encounter:  No orders of the defined types were placed in this encounter.    *If you need refills on other medications prior to your next appointment, please contact your pharmacy*  Follow-Up: Call back or seek an in-person evaluation if the symptoms worsen or if the condition fails to improve as anticipated.  [place] [phone]  Other Instructions RICE Therapy for Routine Care of Injuries Many injuries can be cared for with rest, ice, compression, and elevation (RICE therapy). This includes: Resting the injured body part. Putting ice on the injury.  Using RICE therapy can help to lessen pain and swelling. Supplies  needed: Ice. Plastic bag. Towel. Elastic bandage. Pillow or pillows to raise your injured body part. How to care for your injury with RICE therapy Rest Try to rest the injured part of your body. You can go back to your normal activities when your doctor says it is okay to do them and when you can do them without pain. If you rest the injury too much, it may not heal as well. Some injuries heal better with early movement instead of resting for too long. Ask your doctor if you should do exercises to help your injury get better. Ice  If told, put ice on the injured area. To do this: Put ice in a plastic bag. Place a towel between your skin and the bag. Leave the ice on for 20 minutes, 2-3 times a day. Take off the ice if your skin turns bright red. This is very important. If you cannot feel pain, heat, or cold, you have a greater risk of damage to the area. Do not put ice on your bare skin. Use ice for as many days as your doctor tells you to use it. Compression Put pressure on the injured area. This can be done with an elastic bandage. If this type of bandage has been put on your injury: Follow instructions on the package the bandage came in about how to use it. Do not wrap the bandage too tightly. Wrap the bandage more loosely if part of your body beyond the bandage is blue, swollen, cold, painful, or loses feeling. Take off the bandage and put it on again every 3-4 hours or as  told by your doctor. See your doctor if the bandage seems to make your problems worse.  Elevation Raise the injured area above the level of your heart while you are sitting or lying down. Follow these instructions at home: If your symptoms get worse or last a long time, make a follow-up appointment with your doctor. You may need to have imaging tests, such as X-rays or an MRI. If you have imaging tests, ask how to get your results when they are ready. Return to your normal activities when your doctor says that it  is safe. Keep all follow-up visits. Contact a doctor if: You keep having pain and swelling. Your symptoms get worse. Get help right away if: You have sudden, very bad pain at your injury or lower than your injury. You have redness or more swelling around your injury. You have tingling or numbness at your injury or lower than your injury, and it does not go away when you take off the bandage. Summary Many injuries can be cared for using rest, ice, compression, and elevation (RICE therapy). You can go back to your normal activities when your doctor says it is okay and when you can do them without pain. Put ice on the injured area as told by your doctor. Get help if your symptoms get worse or if you keep having pain and swelling. This information is not intended to replace advice given to you by your health care provider. Make sure you discuss any questions you have with your health care provider. Document Revised: 06/05/2020 Document Reviewed: 06/05/2020 Elsevier Patient Education  [place].    If you have been instructed to have an in-person evaluation today at a local Urgent Care facility, please use the link below. It will take you to a list of all of our available [facility], including address, phone number and hours of operation. Please do not delay care.  [facility]  If you or a family member do not have a primary care provider, use the link below to schedule a visit and establish care. When you choose a [facility] primary care physician or advanced practice provider, you gain a long-term partner in health. Find a Primary Care Provider  Learn more about [facility] in-office and virtual care options: [facility]

## 2022-07-07 ENCOUNTER — Other Ambulatory Visit: Payer: Self-pay | Admitting: Gastroenterology

## 2022-09-27 ENCOUNTER — Encounter: Admitting: Family Medicine

## 2022-10-04 ENCOUNTER — Encounter: Payer: Self-pay | Admitting: Family Medicine

## 2022-10-04 ENCOUNTER — Ambulatory Visit (INDEPENDENT_AMBULATORY_CARE_PROVIDER_SITE_OTHER): Admitting: Family Medicine

## 2022-10-04 VITALS — BP 110/72 | HR 90 | Temp 98.1°F | Ht 67.0 in | Wt 227.4 lb

## 2022-10-04 DIAGNOSIS — Z114 Encounter for screening for human immunodeficiency virus [HIV]: Secondary | ICD-10-CM

## 2022-10-04 DIAGNOSIS — Z139 Encounter for screening, unspecified: Secondary | ICD-10-CM | POA: Diagnosis not present

## 2022-10-04 DIAGNOSIS — Z1159 Encounter for screening for other viral diseases: Secondary | ICD-10-CM | POA: Diagnosis not present

## 2022-10-04 DIAGNOSIS — Z0000 Encounter for general adult medical examination without abnormal findings: Secondary | ICD-10-CM | POA: Diagnosis not present

## 2022-10-04 NOTE — Progress Notes (Signed)
Chief Complaint  Patient presents with   Annual Exam     Well Woman Kiara Norman is here for a complete physical.   Her last physical was >1 year ago.  Current diet: in general, a "pretty good" diet. Current exercise: lifting wts, some cardio. Fatigue out of ordinary? No Seatbelt? Yes Advanced directive? No  Health Maintenance Pap/HPV- Yes Tetanus- Yes HIV screening- Yes Hep C screening- Yes  Past Medical History:  Diagnosis Date   Anemia    GERD (gastroesophageal reflux disease)      Past Surgical History:  Procedure Laterality Date   CESAREAN SECTION     x2   LEEP  2007    Medications  Current Outpatient Medications on File Prior to Visit  Medication Sig Dispense Refill   norgestimate-ethinyl estradiol (ORTHO-CYCLEN, 28,) 0.25-35 MG-MCG tablet      pantoprazole (PROTONIX) 40 MG tablet Take 1 tablet (40 mg total) by mouth daily. 30 tablet 1   Allergies No Known Allergies  Review of Systems: Constitutional:  no unexpected weight changes Eye:  no recent significant change in vision Ear/Nose/Mouth/Throat:  Ears:  no tinnitus or vertigo and no recent change in hearing Nose/Mouth/Throat:  no complaints of nasal congestion, no sore throat Cardiovascular: no chest pain Respiratory:  no cough and no shortness of breath Gastrointestinal:  no abdominal pain, no change in bowel habits GU:  Female: negative for dysuria or pelvic pain Musculoskeletal/Extremities:  no pain of the joints Integumentary (Skin/Breast):  no abnormal skin lesions reported Neurologic:  no headaches Endocrine:  denies fatigue Hematologic/Lymphatic:  No areas of easy bleeding  Exam BP 110/72 (BP Location: Left Arm, Patient Position: Sitting, Cuff Size: Large)   Pulse 90   Temp 98.1 F (36.7 C) (Oral)   Ht 5\' 7"  (1.702 m)   Wt 227 lb 6 oz (103.1 kg)   SpO2 99%   BMI 35.61 kg/m  General:  well developed, well nourished, in no apparent distress Skin:  no significant moles, warts, or  growths Head:  no masses, lesions, or tenderness Eyes:  pupils equal and round, sclera anicteric without injection Ears:  canals without lesions, TMs shiny without retraction, no obvious effusion, no erythema Nose:  nares patent, mucosa normal, and no drainage  Throat/Pharynx:  lips and gingiva without lesion; tongue and uvula midline; non-inflamed pharynx; no exudates or postnasal drainage Neck: neck supple without adenopathy, thyromegaly, or masses Lungs:  clear to auscultation, breath sounds equal bilaterally, no respiratory distress Cardio:  regular rate and rhythm, no bruits, no LE edema Abdomen:  abdomen soft, nontender; bowel sounds normal; no masses or organomegaly Genital: Defer to GYN Musculoskeletal:  symmetrical muscle groups noted without atrophy or deformity Extremities:  no clubbing, cyanosis, or edema, no deformities, no skin discoloration Neuro:  gait normal; deep tendon reflexes normal and symmetric Psych: well oriented with normal range of affect and appropriate judgment/insight  Assessment and Plan  Well adult exam - Plan: CBC, Comprehensive metabolic panel, Lipid panel  Screening for HIV without presence of risk factors - Plan: HIV Antibody (routine testing w rflx)  Encounter for hepatitis C screening test for low risk patient - Plan: Hepatitis C antibody  Screening due - Plan: Glucose 6 phosphate dehydrogenase, Sickle Cell Scr, Ethanol, POCT urine pregnancy, Urinalysis, Type and screen, DRUG MONITORING, PANEL 8 WITH CONFIRMATION, URINE   Well 37 y.o. female. Counseled on diet and exercise. Other orders as above. Extensive lab testing ordered at request of [place]. Will fill out form for her as  well.  Advanced directive form provided today.  Follow up in 1 yr. The patient voiced understanding and agreement to the plan.  [place], DO 10/04/22 1:48 PM

## 2022-10-04 NOTE — Patient Instructions (Signed)
Give us 2-3 business days to get the results of your labs back.   Keep the diet clean and stay active.  Please get me a copy of your advanced directive form at your convenience.   Let us know if you need anything.  

## 2022-10-05 LAB — DRUG MONITORING, PANEL 8 WITH CONFIRMATION, URINE
6 Acetylmorphine: NEGATIVE ng/mL (ref <10)
Alcohol Metabolites: NEGATIVE ng/mL (ref <500)
Amphetamines: NEGATIVE ng/mL (ref <500)
Benzodiazepines: NEGATIVE ng/mL (ref <100)
Buprenorphine, Urine: NEGATIVE ng/mL (ref <5)
Cocaine Metabolite: NEGATIVE ng/mL (ref <150)
Creatinine: 47.7 mg/dL (ref >=20.0)
MDMA: NEGATIVE ng/mL (ref <500)
Marijuana Metabolite: NEGATIVE ng/mL (ref <20)
Opiates: NEGATIVE ng/mL (ref <100)
Oxidant: NEGATIVE ug/mL (ref <200)
Oxycodone: NEGATIVE ng/mL (ref <100)
pH: 6.8 (ref 4.5–9.0)

## 2022-10-05 LAB — DM TEMPLATE

## 2022-10-06 ENCOUNTER — Telehealth: Payer: Self-pay | Admitting: Family Medicine

## 2022-10-06 NOTE — Telephone Encounter (Signed)
Called left message form is complete///can pickup

## 2022-10-06 NOTE — Telephone Encounter (Signed)
Called left message to call back 

## 2022-10-08 ENCOUNTER — Other Ambulatory Visit

## 2022-10-08 NOTE — Telephone Encounter (Signed)
Patient called stating she will pick up the form when she comes for her lab appointment on Monday.

## 2022-10-08 NOTE — Telephone Encounter (Signed)
Great!. I will put at the front ready for her to pickup.

## 2022-10-08 NOTE — Telephone Encounter (Signed)
Called left message to call back/form is complete/still on my desk.

## 2022-10-11 ENCOUNTER — Telehealth: Payer: Self-pay

## 2022-10-11 ENCOUNTER — Other Ambulatory Visit (INDEPENDENT_AMBULATORY_CARE_PROVIDER_SITE_OTHER)

## 2022-10-11 DIAGNOSIS — Z114 Encounter for screening for human immunodeficiency virus [HIV]: Secondary | ICD-10-CM

## 2022-10-11 DIAGNOSIS — Z0000 Encounter for general adult medical examination without abnormal findings: Secondary | ICD-10-CM

## 2022-10-11 DIAGNOSIS — Z139 Encounter for screening, unspecified: Secondary | ICD-10-CM

## 2022-10-11 DIAGNOSIS — Z1159 Encounter for screening for other viral diseases: Secondary | ICD-10-CM

## 2022-10-11 LAB — CBC
HCT: 39.3 % (ref 36.0–46.0)
Hemoglobin: 13 g/dL (ref 12.0–15.0)
MCHC: 33.2 g/dL (ref 30.0–36.0)
MCV: 91.9 fl (ref 78.0–100.0)
Platelets: 280 K/uL (ref 150.0–400.0)
RBC: 4.27 Mil/uL (ref 3.87–5.11)
RDW: 13.2 % (ref 11.5–15.5)
WBC: 7 K/uL (ref 4.0–10.5)

## 2022-10-11 LAB — URINALYSIS
Bilirubin Urine: NEGATIVE
Hgb urine dipstick: NEGATIVE
Ketones, ur: NEGATIVE
Leukocytes,Ua: NEGATIVE
Nitrite: NEGATIVE
Specific Gravity, Urine: 1.025 (ref 1.000–1.030)
Total Protein, Urine: NEGATIVE
Urine Glucose: NEGATIVE
Urobilinogen, UA: 0.2 (ref 0.0–1.0)
pH: 6 (ref 5.0–8.0)

## 2022-10-11 LAB — COMPREHENSIVE METABOLIC PANEL
ALT: 22 U/L (ref 0–35)
AST: 18 U/L (ref 0–37)
Albumin: 3.9 g/dL (ref 3.5–5.2)
Alkaline Phosphatase: 45 U/L (ref 39–117)
BUN: 12 mg/dL (ref 6–23)
CO2: 25 mEq/L (ref 19–32)
Calcium: 9.1 mg/dL (ref 8.4–10.5)
Chloride: 103 mEq/L (ref 96–112)
Creatinine, Ser: 0.9 mg/dL (ref 0.40–1.20)
GFR: 83.34 mL/min (ref >60.00)
Glucose, Bld: 85 mg/dL (ref 70–99)
Potassium: 4.2 mEq/L (ref 3.5–5.1)
Sodium: 138 mEq/L (ref 135–145)
Total Bilirubin: 0.4 mg/dL (ref 0.2–1.2)
Total Protein: 6.2 g/dL (ref 6.0–8.3)

## 2022-10-11 LAB — LIPID PANEL
Cholesterol: 163 mg/dL (ref 0–200)
HDL: 58 mg/dL (ref >39.00)
LDL Cholesterol: 88 mg/dL (ref 0–99)
NonHDL: 105.49
Total CHOL/HDL Ratio: 3
Triglycerides: 88 mg/dL (ref 0.0–149.0)
VLDL: 17.6 mg/dL (ref 0.0–40.0)

## 2022-10-11 NOTE — Addendum Note (Signed)
Addended by: Manuela Schwartz on: 10/11/2022 11:30 AM   Modules accepted: Orders

## 2022-10-11 NOTE — Telephone Encounter (Signed)
Called patient and advised her that we had to change one of her labs and due to that we need to collect the correct tube. Patient verbalized understanding and will return on Friday 10/15/2022

## 2022-10-11 NOTE — Addendum Note (Signed)
Addended by: Manuela Schwartz on: 10/11/2022 01:08 PM   Modules accepted: Orders

## 2022-10-12 LAB — SICKLE CELL SCREEN: Sickle Solubility Test - HGBRFX: NEGATIVE

## 2022-10-12 LAB — HIV ANTIBODY (ROUTINE TESTING W REFLEX): HIV 1&2 Ab, 4th Generation: NONREACTIVE

## 2022-10-12 LAB — HEPATITIS C ANTIBODY: Hepatitis C Ab: NONREACTIVE

## 2022-10-13 LAB — ETHANOL: Ethanol: <0.01 %

## 2022-10-14 LAB — GLUCOSE 6 PHOSPHATE DEHYDROGENASE: G-6PDH: 14.6 U/g Hgb (ref 7.0–20.5)

## 2022-10-15 ENCOUNTER — Other Ambulatory Visit

## 2022-10-22 ENCOUNTER — Encounter: Payer: Self-pay | Admitting: Family Medicine

## 2022-10-27 ENCOUNTER — Ambulatory Visit

## 2022-10-27 ENCOUNTER — Other Ambulatory Visit

## 2022-10-27 DIAGNOSIS — Z139 Encounter for screening, unspecified: Secondary | ICD-10-CM

## 2022-10-27 DIAGNOSIS — Z136 Encounter for screening for cardiovascular disorders: Secondary | ICD-10-CM

## 2022-10-27 NOTE — Progress Notes (Signed)
No charge. Pt returned for blood typing ordered incorrectly at initial visit and was not drawn when it was intended to be.

## 2022-10-27 NOTE — Progress Notes (Signed)
Pt here for EKG per Dr.Wendling  You can call the office and schedule just a Nurse Visit appointment for this,   EKG (for paperwork) and is ok per your Provider. EKG was Normal

## 2022-10-28 LAB — ABO AND RH

## 2022-12-01 ENCOUNTER — Encounter: Payer: Self-pay | Admitting: Family Medicine

## 2022-12-03 ENCOUNTER — Encounter: Payer: Self-pay | Admitting: Family Medicine

## 2022-12-13 ENCOUNTER — Encounter: Payer: Self-pay | Admitting: *Deleted

## 2023-01-16 ENCOUNTER — Telehealth: Admitting: Nurse Practitioner

## 2023-01-16 DIAGNOSIS — H189 Unspecified disorder of cornea: Secondary | ICD-10-CM | POA: Diagnosis not present

## 2023-01-16 NOTE — Patient Instructions (Signed)
  Kiara Norman, thank you for joining Claiborne Rigg, NP for today's virtual visit.  While this provider is not your primary care provider (PCP), if your PCP is located in our provider database this encounter information will be shared with them immediately following your visit.   [name] MyChart account gives you access to today's visit and all your visits, tests, and labs performed at [facility] " click here if you don't have a [facility] MyChart account or go to [email or web address]  Consent: (Patient) Kiara Norman provided verbal consent for this virtual visit at the beginning of the encounter.  Current Medications:  Current Outpatient Medications:    norgestimate-ethinyl estradiol (ORTHO-CYCLEN, 28,) 0.25-35 MG-MCG tablet, , Disp: , Rfl:    pantoprazole (PROTONIX) 40 MG tablet, Take 1 tablet (40 mg total) by mouth daily., Disp: 30 tablet, Rfl: 1   Medications ordered in this encounter:  No orders of the defined types were placed in this encounter.    *If you need refills on other medications prior to your next appointment, please contact your pharmacy*  Follow-Up: Call back or seek an in-person evaluation if the symptoms worsen or if the condition fails to improve as anticipated.  [facility] [phone]  Other Instructions Needs fluorescein stain and face to face visit.   If you have been instructed to have an in-person evaluation today at a local Urgent Care facility, please use the link below. It will take you to a list of all of our available [facility], including address, phone number and hours of operation. Please do not delay care.  [facility]  If you or a family member do not have a primary care provider, use the link below to schedule a visit and establish care. When you choose a [facility] primary care physician or advanced practice provider, you gain a long-term partner in health. Find a  Primary Care Provider  Learn more about [facility] in-office and virtual care options: [facility] - Get Care Now

## 2023-01-16 NOTE — Progress Notes (Signed)
Virtual Visit Consent   Kiara Norman, you are scheduled for a virtual visit with a [facility] provider today. Just as with appointments in the office, your consent must be obtained to participate. Your consent will be active for this visit and any virtual visit you may have with one of our providers in the next 365 days. If you have a MyChart account, a copy of this consent can be sent to you electronically.  As this is a virtual visit, video technology does not allow for your provider to perform a traditional examination. This may limit your provider's ability to fully assess your condition. If your provider identifies any concerns that need to be evaluated in person or the need to arrange testing (such as labs, EKG, etc.), we will make arrangements to do so. Although advances in technology are sophisticated, we cannot ensure that it will always work on either your end or our end. If the connection with a video visit is poor, the visit may have to be switched to a telephone visit. With either a video or telephone visit, we are not always able to ensure that we have a secure connection.  By engaging in this virtual visit, you consent to the provision of healthcare and authorize for your insurance to be billed (if applicable) for the services provided during this visit. Depending on your insurance coverage, you may receive a charge related to this service.  I need to obtain your verbal consent now. Are you willing to proceed with your visit today? Kiara Norman has provided verbal consent on 01/16/2023 for a virtual visit (video or telephone). Claiborne Rigg, NP  Date: 01/16/2023 10:53 AM  Virtual Visit via Video Note   I, Claiborne Rigg, connected with  Kiara Norman  ([id number], 06-Mar-1986) on 01/16/23 at 10:45 AM EDT by a video-enabled telemedicine application and verified that I am speaking with the correct person using two identifiers.  Location: Patient: Virtual Visit Location Patient:  Home Provider: Virtual Visit Location Provider: Home Office   I discussed the limitations of evaluation and management by telemedicine and the availability of in person appointments. The patient expressed understanding and agreed to proceed.    History of Present Illness: Kiara Norman is a 37 y.o. who identifies as a female who was assigned female at birth, and is being seen today for Possible corneal abrasion.   Kiara Norman states she is experiencing left eye pain, light sensitivity and tearing since yesterday. She states she has a history of a previous corneal abrasion and current symptoms feel similar. She has been flushing her eye with normal saline and water with no relief. She believes this was caused by her wearing [place]ara.   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   Encounter for other administrative examinations 06/18/2021   Low back pain 05/28/2021   Pain in right shoulder 03/05/2021   Irritable bowel syndrome 11/09/2019   Seasonal allergies 11/09/2019   Hair thinning 03/06/2018   Pain of left hip joint 03/06/2018   Vitamin D deficiency 09/17/2010    Allergies: No Known Allergies Medications:  Current Outpatient Medications:    norgestimate-ethinyl estradiol (ORTHO-CYCLEN, 28,) 0.25-35 MG-MCG tablet, , Disp: , Rfl:    pantoprazole (PROTONIX) 40 MG tablet, Take 1 tablet (40 mg total) by mouth daily., Disp: 30 tablet, Rfl: 1  Observations/Objective: Patient is well-developed, well-nourished in no acute distress.  Resting comfortably  at home.  Head is  normocephalic, atraumatic.  No labored breathing.  Speech is clear and coherent with logical content.  Patient is alert and oriented at baseline.    Assessment and Plan: 1. Corneal abnormality Needs fluorescein stain and face to face visit.   Follow Up Instructions: I discussed the assessment and treatment plan with the patient. The patient was  provided an opportunity to ask questions and all were answered. The patient agreed with the plan and demonstrated an understanding of the instructions.  A copy of instructions were sent to the patient via MyChart unless otherwise noted below.    The patient was advised to call back or seek an in-person evaluation if the symptoms worsen or if the condition fails to improve as anticipated.  Time:  I spent 8 minutes with the patient via telehealth technology discussing the above problems/concerns.    Claiborne Rigg, NP

## 2023-05-18 ENCOUNTER — Other Ambulatory Visit: Payer: Self-pay | Admitting: Family Medicine

## 2023-05-18 ENCOUNTER — Other Ambulatory Visit: Payer: Self-pay

## 2023-05-18 MED ORDER — NORGESTIMATE-ETH ESTRADIOL 0.25-35 MG-MCG PO TABS
1.0000 | ORAL_TABLET | Freq: Every day | ORAL | 12 refills | Status: DC
  Filled 2023-05-18 – 2023-12-08 (×2): qty 28, 28d supply, fill #0

## 2023-12-08 ENCOUNTER — Other Ambulatory Visit (HOSPITAL_BASED_OUTPATIENT_CLINIC_OR_DEPARTMENT_OTHER): Payer: Self-pay

## 2023-12-21 ENCOUNTER — Other Ambulatory Visit (HOSPITAL_BASED_OUTPATIENT_CLINIC_OR_DEPARTMENT_OTHER): Payer: Self-pay

## 2023-12-26 ENCOUNTER — Encounter: Admitting: Family Medicine

## 2024-01-13 ENCOUNTER — Ambulatory Visit (INDEPENDENT_AMBULATORY_CARE_PROVIDER_SITE_OTHER): Admitting: Family Medicine

## 2024-01-13 ENCOUNTER — Encounter: Payer: Self-pay | Admitting: Family Medicine

## 2024-01-13 VITALS — BP 110/60 | HR 80 | Resp 18 | Ht 67.0 in | Wt 223.0 lb

## 2024-01-13 DIAGNOSIS — M25572 Pain in left ankle and joints of left foot: Secondary | ICD-10-CM

## 2024-01-13 NOTE — Patient Instructions (Signed)
Heat (pad or rice pillow in microwave) over affected area, 10-15 minutes twice daily.   Ice/cold pack over area for 10-15 min twice daily.  OK to take Tylenol 1000 mg (2 extra strength tabs) or 975 mg (3 regular strength tabs) every 6 hours as needed.  Let us know if you need anything.  Ankle Exercises It is normal to feel mild stretching, pulling, tightness, or discomfort as you do these exercises, but you should stop right away if you feel sudden pain or your pain gets worse.  Stretching and range of motion exercises These exercises warm up your muscles and joints and improve the movement and flexibility of your ankle. These exercises also help to relieve pain, numbness, and tingling. Exercise A: Dorsiflexion/Plantar Flexion    Sit with your affected knee straight or bent. Do not rest your foot on anything. Flex your affected ankle to tilt the top of your foot toward your shin. Hold this position for 5 seconds. Point your toes downward to tilt the top of your foot away from your shin. Hold this position for 5 seconds. Repeat 2 times. Complete this exercise 3 times per week. Exercise B: Ankle Alphabet    Sit with your affected foot supported at your lower leg. Do not rest your foot on anything. Make sure your foot has room to move freely. Think of your affected foot as a paintbrush, and move your foot to trace each letter of the alphabet in the air. Keep your hip and knee still while you trace. Make the letters as large as you can without increasing any discomfort. Trace every letter from A to Z. Repeat 2 times. Complete this exercise 3 times per week. Strengthening exercises These exercises build strength and endurance in your ankle. Endurance is the ability to use your muscles for a long time, even after they get tired. Exercise D: Dorsiflexors    Secure a rubber exercise band or tube to an object, such as a table leg, that will stay still when the band is pulled. Secure the  other end around your affected foot. Sit on the floor, facing the object with your affected leg extended. The band or tube should be slightly tense when your foot is relaxed. Slowly flex your affected ankle and toes to bring your foot toward you. Hold this position for 3 seconds.  Slowly return your foot to the starting position, controlling the band as you do that. Do a total of 10 repetitions. Repeat 2 times. Complete this exercise 3 times per week. Exercise E: Plantar Flexors    Sit on the floor with your affected leg extended. Loop a rubber exercise band or tube around the ball of your affected foot. The ball of your foot is on the walking surface, right under your toes. The band or tube should be slightly tense when your foot is relaxed. Slowly point your toes downward, pushing them away from you. Hold this position for 3 seconds. Slowly release the tension in the band or tube, controlling smoothly until your foot is back in the starting position. Repeat for a total of 10 repetitions. Repeat 2 times. Complete this exercise 3 times per week. Exercise F: Towel Curls    Sit in a chair on a non-carpeted surface, and put your feet on the floor. Place a towel in front of your feet.  Keeping your heel on the floor, put your affected foot on the towel. Pull the towel toward you by grabbing the towel with your toes  and curling them under. Keep your heel on the floor. Let your toes relax. Grab the towel again. Keep going until the towel is completely underneath your foot. Repeat for a total of 10 repetitions. Repeat 2 times. Complete this exercise 3 times per week. Exercise G: Heel Raise ( Plantar Flexors, Standing)     Stand with your feet shoulder-width apart. Keep your weight spread evenly over the width of your feet while you rise up on your toes. Use a wall or table to steady yourself, but try not to use it for support. If this exercise is too easy, try these options: Shift your  weight toward your affected leg until you feel challenged. If told by your health care provider, lift your uninjured leg off the floor. Hold this position for 3 seconds. Repeat for a total of 10 repetitions. Repeat 2 times. Complete this exercise 3 times per week. Exercise H: Tandem Walking Stand with one foot directly in front of the other. Slowly raise your back foot up, lifting your heel before your toes, and place it directly in front of your other foot. Continue to walk in this heel-to-toe way for 10 steps or for as long as told by your health care provider. Have a countertop or wall nearby to use if needed to keep your balance, but try not to hold onto anything for support. Repeat 2 times. Complete this exercises 3 times per week. Make sure you discuss any questions you have with your health care provider. Document Released: 06/30/2005 Document Revised: 04/15/2016 Document Reviewed: 05/04/2015 Elsevier Interactive Patient Education  2018 [employer].

## 2024-01-13 NOTE — Progress Notes (Signed)
 Musculoskeletal Exam  Patient: Kiara Norman DOB: 1986/06/25  DOS: 01/13/2024  SUBJECTIVE:  Chief Complaint:   Chief Complaint  Patient presents with   Ankle Pain    Left onset: 5-6 weeks    Kiara Norman is a 38 y.o.  female for evaluation and treatment of L ankle pain.   Onset:  6 weeks ago. No inj or change in activity. Did seem to start after playing soccer.  Location: lateral ankle and up lateral leg Character:  aching  Progression of issue:  waxes and wanes; worse when she walks a lot or runs/jumps Associated symptoms: swelling, radiation up LLE No bruising/redness.  Treatment: to date has been ice, OTC NSAIDS, elevation, compression.   Neurovascular symptoms: no  Past Medical History:  Diagnosis Date   Anemia    GERD (gastroesophageal reflux disease)     Objective: VITAL SIGNS: BP 110/60   Pulse 80   Resp 18   Ht 5\' 7"  (1.702 m)   Wt 223 lb (101.2 kg)   LMP 12/28/2023   SpO2 96%   BMI 34.93 kg/m  Constitutional: Well formed, well developed. No acute distress. Thorax & Lungs: No accessory muscle use Musculoskeletal: Left ankle.   Normal active range of motion: no.   Normal passive range of motion: no Tenderness to palpation: no Deformity: no Ecchymosis: no Tests positive: None Tests negative: Squeeze, anterior drawer Neurologic: Normal sensory function.  Gait is normal. Psychiatric: Normal mood. Age appropriate judgment and insight. Alert & oriented x 3.    Assessment:  Left ankle pain, unspecified chronicity  Plan: Seems like more involvement of the peroneus longus/brevis.  Stretches/exercises, heat, ice, Tylenol.  Consider physical therapy if not improved in the next 3 to 4 weeks. F/u for physical at her convenience.. The patient voiced understanding and agreement to the plan.   Shellie Dials [place], DO 01/13/24  12:05 PM

## 2024-01-31 ENCOUNTER — Encounter: Admitting: Family Medicine

## 2024-02-07 LAB — HM PAP SMEAR

## 2024-04-16 ENCOUNTER — Encounter: Payer: Self-pay | Admitting: Family Medicine

## 2024-04-16 ENCOUNTER — Ambulatory Visit: Admitting: Family Medicine

## 2024-04-16 VITALS — BP 118/70 | HR 95 | Temp 97.8°F | Resp 16 | Ht 67.0 in | Wt 219.4 lb

## 2024-04-16 DIAGNOSIS — M76892 Other specified enthesopathies of left lower limb, excluding foot: Secondary | ICD-10-CM

## 2024-04-16 MED ORDER — MELOXICAM 15 MG PO TABS: 15.0000 mg | ORAL_TABLET | Freq: Every day | ORAL | 0 refills | Status: DC

## 2024-04-16 NOTE — Patient Instructions (Signed)
 Heat (pad or rice pillow in microwave) over affected area, 10-15 minutes twice daily.   Ice/cold pack over area for 10-15 min twice daily.  OK to take Tylenol 1000 mg (2 extra strength tabs) or 975 mg (3 regular strength tabs) every 6 hours as needed.  Take the meloxicam  daily for the next 2 weeks and then daily as needed.   If you do not hear anything about your referral in the next few days, call our office and ask for an update.  Let us  know if you need anything.  Hip Exercises It is normal to feel mild stretching, pulling, tightness, or discomfort as you do these exercises, but you should stop right away if you feel sudden pain or your pain gets worse.   STRETCHING AND RANGE OF MOTION EXERCISES These exercises warm up your muscles and joints and improve the movement and flexibility of your hip. These exercises also help to relieve pain, numbness, and tingling. Exercise A: Hamstrings, Supine  Lie on your back. Loop a belt or towel over the ball of your left / right foot. The ball of your foot is on the walking surface, right under your toes. Straighten your left / right knee and slowly pull on the belt to raise your leg. Do not let your left / right knee bend while you do this. Keep your other leg flat on the floor. Raise the left / right leg until you feel a gentle stretch behind your left / right knee or thigh. Hold this position for 30 seconds. Slowly return your leg to the starting position. Repeat2 times. Complete this stretch 3 times per week. Exercise B: Hip Rotators  Lie on your back on a firm surface. Hold your left / right knee with your left / right hand. Hold your ankle with your other hand. Gently pull your left / right knee and rotate your lower leg toward your other shoulder. Pull until you feel a stretch in your buttocks. Keep your hips and shoulders firmly planted while you do this stretch. Hold this position for 30 seconds. Repeat 2 times. Complete this  stretch 3 times per week. Exercise C: V-Sit (Hamstrings and Adductors)  Sit on the floor with your legs extended in a large "V" shape. Keep your knees straight during this exercise. Start with your head and chest upright, then bend at your waist to reach for your left foot (position A). You should feel a stretch in your right inner thigh. Hold this position for 30 seconds. Then slowly return to the upright position. Bend at your waist to reach forward (position B). You should feel a stretch behind both of your thighs and knees. Hold this position for 30 seconds. Then slowly return to the upright position. Bend at your waist to reach for your right foot (position C). You should feel a stretch in your left inner thigh. Hold this position for 30 seconds. Then slowly return to the upright position. Repeat A, B, and C 2 times each. Complete this stretch 3 times per week. Exercise D: Lunge (Hip Flexors)  Place your left / right knee on the floor and bend your other knee so that is directly over your ankle. You should be half-kneeling. Keep good posture with your head over your shoulders. Tighten your buttocks to point your tailbone downward. This helps your back to keep from arching too much. You should feel a gentle stretch in the front of your left / right thigh and hip. If you do  not feel any resistance, slightly slide your other foot forward and then slowly lunge forward so your knee once again lines up over your ankle. Make sure your tailbone continues to point downward. Hold this position for 30 seconds. Repeat 2 times. Complete this stretch 3 times per week.  STRENGTHENING EXERCISES These exercises build strength and endurance in your hip. Endurance is the ability to use your muscles for a long time, even after they get tired. Exercise E: Bridge (Hip Extensors)  Lie on your back on a firm surface with your knees bent and your feet flat on the floor. Tighten your buttocks muscles and lift  your bottom off the floor until the trunk of your body is level with your thighs. Do not arch your back. You should feel the muscles working in your buttocks and the back of your thighs. If you do not feel these muscles, slide your feet 1-2 inches (2.5-5 cm) farther away from your buttocks. Hold this position for 3 seconds. Slowly lower your hips to the starting position. Repeat for a total of 10 repetitions. Let your muscles relax completely between repetitions. If this exercise is too easy, try doing it with your arms crossed over your chest. Repeat 2 times. Complete this exercise 3 times per week. Exercise F: Straight Leg Raises - Hip Abductors  Lie on your side with your left / right leg in the top position. Lie so your head, shoulder, knee, and hip line up with each other. You may bend your bottom knee to help you balance. Roll your hips slightly forward, so your hips are stacked directly over each other and your left / right knee is facing forward. Leading with your heel, lift your top leg 4-6 inches (10-15 cm). You should feel the muscles in your outer hip lifting. Do not let your foot drift forward. Do not let your knee roll toward the ceiling. Hold this position for 1 second. Slowly return to the starting position. Let your muscles relax completely between repetitions. Repeat for a total of 10 repetitions.  Repeat 2 times. Complete this exercise 3 times per week. Exercise G: Straight Leg Raises - Hip Adductors  Lie on your side with your left / right leg in the bottom position. Lie so your head, shoulder, knee, and hip line up. You may place your upper foot in front to help you balance. Roll your hips slightly forward, so your hips are stacked directly over each other and your left / right knee is facing forward. Tense the muscles in your inner thigh and lift your bottom leg 4-6 inches (10-15 cm). Hold this position for 1 second. Slowly return to the starting position. Let your  muscles relax completely between repetitions. Repeat for a total of 10 repetitions. Repeat 2 times. Complete this exercise 3 times per week. Exercise H: Straight Leg Raises - Quadriceps  Lie on your back with your left / right leg extended and your other knee bent. Tense the muscles in the front of your left / right thigh. When you do this, you should see your kneecap slide up or see increased dimpling just above your knee. Tighten these muscles even more and raise your leg 4-6 inches (10-15 cm) off the floor. Hold this position for 3 seconds. Keep these muscles tense as you lower your leg. Relax the muscles slowly and completely between repetitions. Repeat for a total of 10 repetitions. Repeat 2 times. Complete this exercise 3 times per week. Exercise I: Hip Abductors, Standing  Tie one end of a rubber exercise band or tubing to a secure surface, such as a table or pole. Loop the other end of the band or tubing around your left / right ankle. Keeping your ankle with the band or tubing directly opposite of the secured end, step away until there is tension in the tubing or band. Hold onto a chair as needed for balance. Lift your left / right leg out to your side. While you do this: Keep your back upright. Keep your shoulders over your hips. Keep your toes pointing forward. Make sure to use your hip muscles to lift your leg. Do not throw your leg or tip your body to lift your leg. Hold this position for 1 second. Slowly return to the starting position. Repeat for a total of 10 repetitions. Repeat 2 times. Complete this exercise 3 times per week. Exercise J: Squats (Quadriceps) Stand in a door frame so your feet and knees are in line with the frame. You may place your hands on the frame for balance. Slowly bend your knees and lower your hips like you are going to sit in a chair. Keep your lower legs in a straight-up-and-down position. Do not let your hips go lower than your knees. Do not  bend your knees lower than told by your health care provider. If your hip pain increases, do not bend as low. Hold this position for 1 second. Slowly push with your legs to return to standing. Do not use your hands to pull yourself to standing. Repeat for a total of 10 repetitions. Repeat 2 times. Complete this exercise 3 times per week. Make sure you discuss any questions you have with your health care provider. Document Released: 09/03/2005 Document Revised: 05/10/2016 Document Reviewed: 08/11/2015 Elsevier Interactive Patient Education  [employer].

## 2024-04-16 NOTE — Progress Notes (Signed)
 Musculoskeletal Exam  Patient: Kiara Norman DOB: 16-Jun-1986  DOS: 04/16/2024  SUBJECTIVE:  Chief Complaint:   Chief Complaint  Patient presents with   Hip Pain    Left Hip Pain    Kiara Norman is a 38 y.o.  female for evaluation and treatment of L hip pain.   Onset:  1 week ago.  She was hiking and after she got done, noticed the pain.  She was doing a lot of elevation hiking. Very difficult to run, squat, and do other certain leg lifts.  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 Location: groin region Character:  sharp  Progression of issue:  has worsened Associated symptoms: radiation to low back; worsening when she flexes hip No bruising, redness, swelling.  Treatment: to date has been ice and topical NSAID, ibuprofen.   Neurovascular symptoms: no  Past Medical History:  Diagnosis Date   Anemia    GERD (gastroesophageal reflux disease)     Objective: VITAL SIGNS: BP 118/70 (BP Location: Left Arm, Patient Position: Sitting)   Pulse 95   Temp 97.8 F (36.6 C) (Oral)   Resp 16   Ht 5' 7 (1.702 m)   Wt 219 lb 6.4 oz (99.5 kg)   SpO2 96%   BMI 34.36 kg/m  Constitutional: Well formed, well developed. No acute distress. Thorax & Lungs: No accessory muscle use Musculoskeletal: L hip.   Normal active range of motion: yes.   Normal passive range of motion: yes Tenderness to palpation: Yes over the left hip flexor Deformity: no Ecchymosis: no Tests positive: Stinchfield Tests negative: Ober's, logroll, FADIR, FABER Neurologic: Normal sensory function.  Psychiatric: Normal mood. Age appropriate judgment and insight. Alert & oriented x 3.    Assessment:  Hip flexor tendinitis, left - Plan: Ambulatory referral to Physical Therapy, meloxicam  (MOBIC ) 15 MG tablet  Plan: Given the upcoming training, we will set her up with physical therapy urgently.  Meloxicam  daily for the next 2 weeks and then daily as needed.   Stretches/exercises, heat, ice, Tylenol.  F/u as originally scheduled. The patient voiced understanding and agreement to the plan.   Mabel [place], DO 04/16/24  7:26 AM

## 2024-04-17 ENCOUNTER — Encounter: Payer: Self-pay | Admitting: Family Medicine

## 2024-05-13 ENCOUNTER — Other Ambulatory Visit: Payer: Self-pay | Admitting: Family Medicine

## 2024-05-13 DIAGNOSIS — M76892 Other specified enthesopathies of left lower limb, excluding foot: Secondary | ICD-10-CM

## 2024-06-26 ENCOUNTER — Ambulatory Visit: Admitting: Family Medicine

## 2024-07-09 ENCOUNTER — Ambulatory Visit: Payer: Self-pay | Admitting: Nurse Practitioner

## 2024-07-09 ENCOUNTER — Ambulatory Visit

## 2024-07-09 ENCOUNTER — Ambulatory Visit
Admission: EM | Admit: 2024-07-09 | Discharge: 2024-07-09 | Disposition: A | Discharge status: Home / Self Care | Source: Ambulatory Visit | Attending: Family Medicine | Admitting: Family Medicine

## 2024-07-09 ENCOUNTER — Other Ambulatory Visit: Payer: Self-pay

## 2024-07-09 DIAGNOSIS — S8002XA Contusion of left knee, initial encounter: Secondary | ICD-10-CM

## 2024-07-09 DIAGNOSIS — S80212A Abrasion, left knee, initial encounter: Secondary | ICD-10-CM

## 2024-07-09 DIAGNOSIS — M25562 Pain in left knee: Secondary | ICD-10-CM | POA: Diagnosis not present

## 2024-07-09 NOTE — Discharge Instructions (Addendum)
 Your knee x-ray was negative.  Continue keeping the wound clean and dry and applying over-the-counter Neosporin as needed.  Continue the Ace wrap to help with swelling and support of the joint.  Continue elevation ice and over-the-counter Tylenol or ibuprofen as needed.  Please follow-up with your PCP if your symptoms do not improve.  Please go to the ER for any worsening symptoms.  Hope you feel better soon!

## 2024-07-09 NOTE — ED Provider Notes (Signed)
 [facility]    CSN: [id number] Arrival date & time: 07/09/24  0804      History   Chief Complaint No chief complaint on file.   HPI Kiara Norman is a 38 y.o. female presents for evaluation of knee injury.  Patient reports 2 days ago she slipped and fell onto her left knee.  She sustained an abrasion with some bruising to the anterior knee.  States she is able to bear weight with some discomfort.  Endorses some mild swelling but no numbness or tingling.  No history of knee injuries or surgeries.  She has been using Neosporin to the abrasion as well as RICE therapy for the knee.  She is also been taking ibuprofen as well as a previous prescription for meloxicam .  No other concerns at this time.  HPI  Past Medical History:  Diagnosis Date   Anemia    GERD (gastroesophageal reflux disease)     Patient Active Problem List   Diagnosis Date Noted   History of military deployment 06/18/2021   Encounter for other administrative examinations 06/18/2021   Low back pain 05/28/2021   Pain in right shoulder 03/05/2021   Irritable bowel syndrome 11/09/2019   Seasonal allergies 11/09/2019   Hair thinning 03/06/2018   Pain of left hip joint 03/06/2018   Vitamin D deficiency 09/17/2010    Past Surgical History:  Procedure Laterality Date   CESAREAN SECTION     x2   LEEP  2007    OB History   No obstetric history on file.      Home Medications    Prior to Admission medications   Medication Sig Start Date End Date Taking? Authorizing Provider  meloxicam  (MOBIC ) 15 MG tablet TAKE 1 TABLET (15 MG TOTAL) BY MOUTH DAILY. 05/14/24   Frann Mabel Mt, DO  norgestimate -ethinyl estradiol  (ORTHO-CYCLEN) 0.25-35 MG-MCG tablet Take 1 tablet by mouth daily. 05/18/23   Frann Mabel Mt, DO  pantoprazole  (PROTONIX ) 40 MG tablet Take 1 tablet (40 mg total) by mouth daily. 11/24/21   Frann Mabel Mt, DO    Family History Family History  Problem Relation Age  of Onset   Arthritis Mother    Rectal cancer Maternal Grandmother    Gastric cancer Maternal Grandmother    Colon cancer Maternal Grandfather    Prostate cancer Maternal Grandfather    Arthritis Paternal Grandfather    Esophageal cancer Neg Hx     Social History Social History   Tobacco Use   Smoking status: Never   Smokeless tobacco: Never  Vaping Use   Vaping status: Never Used  Substance Use Topics   Alcohol use: Yes    Comment: occasional   Drug use: Never     Allergies   Patient has no known allergies.   Review of Systems Review of Systems  Musculoskeletal:        Knee injury     Physical Exam Triage Vital Signs ED Triage Vitals  Encounter Vitals Group     BP 07/09/24 0816 118/82     Girls Systolic BP Percentile --      Girls Diastolic BP Percentile --      Boys Systolic BP Percentile --      Boys Diastolic BP Percentile --      Pulse Rate 07/09/24 0816 84     Resp 07/09/24 0816 16     Temp 07/09/24 0816 98.2 F (36.8 C)     Temp Source 07/09/24 0816 Oral  SpO2 07/09/24 0816 97 %     Weight --      Height --      Head Circumference --      Peak Flow --      Pain Score 07/09/24 0815 7     Pain Loc --      Pain Education --      Exclude from Growth Chart --    No data found.  Updated Vital Signs BP 118/82   Pulse 84   Temp 98.2 F (36.8 C) (Oral)   Resp 16   LMP 07/05/2024   SpO2 97%   Visual Acuity Right Eye Distance:   Left Eye Distance:   Bilateral Distance:    Right Eye Near:   Left Eye Near:    Bilateral Near:     Physical Exam Vitals and nursing note reviewed.  Constitutional:      General: She is not in acute distress.    Appearance: Normal appearance. She is not ill-appearing.  HENT:     Head: Normocephalic and atraumatic.  Eyes:     Pupils: Pupils are equal, round, and reactive to light.  Cardiovascular:     Rate and Rhythm: Normal rate.  Pulmonary:     Effort: Pulmonary effort is normal.  Musculoskeletal:      Left knee: No bony tenderness or crepitus. Tenderness present over the medial joint line. No patellar tendon tenderness. Normal patellar mobility. Normal pulse.       Legs:     Comments: There is a healing abrasion to the anterior left knee with mild ecchymosis and swelling.  No drainage, warmth or erythema.  Positive valgus stress test.  Skin:    General: Skin is warm and dry.  Neurological:     General: No focal deficit present.     Mental Status: She is alert and oriented to person, place, and time.  Psychiatric:        Mood and Affect: Mood normal.        Behavior: Behavior normal.      UC Treatments / Results  Labs (all labs ordered are listed, but only abnormal results are displayed) Labs Reviewed - No data to display  EKG   Radiology DG Knee Complete 4 Views Left Result Date: 07/09/2024 CLINICAL DATA:  Fall and trauma to the left knee. EXAM: LEFT KNEE - COMPLETE 4+ VIEW COMPARISON:  None Available. FINDINGS: No evidence of fracture, dislocation, or joint effusion. No evidence of arthropathy or other focal bone abnormality. Soft tissues are unremarkable. IMPRESSION: Negative. Electronically Signed   By: Vanetta Chou M.D.   On: 07/09/2024 08:35    Procedures Procedures (including critical care time)  Medications Ordered in UC Medications - No data to display  Initial Impression / Assessment and Plan / UC Course  I have reviewed the triage vital signs and the nursing notes.  Pertinent labs & imaging results that were available during my care of the patient were reviewed by me and considered in my medical decision making (see chart for details).     Reviewed exam and symptoms with patient.  No red flags.  X-ray negative for fracture.  Discussed contusion/abrasion and continuation of RICE therapy.  Dressing applied to abrasion and Ace wrap applied as well.  Continue elevation, ice and OTC analgesics as needed.  PCP follow-up if symptoms do not improve.  ER  precautions reviewed. Final Clinical Impressions(s) / UC Diagnoses   Final diagnoses:  Abrasion of left knee, initial encounter  Contusion of left knee, initial encounter     Discharge Instructions      Your knee x-ray was negative.  Continue keeping the wound clean and dry and applying over-the-counter Neosporin as needed.  Continue the Ace wrap to help with swelling and support of the joint.  Continue elevation ice and over-the-counter Tylenol or ibuprofen as needed.  Please follow-up with your PCP if your symptoms do not improve.  Please go to the ER for any worsening symptoms.  Hope you feel better soon!     ED Prescriptions   None    PDMP not reviewed this encounter.   Loreda Myla SAUNDERS, NP 07/09/24 [phone]

## 2024-07-09 NOTE — ED Triage Notes (Signed)
 Pt states she was running and slipped and fell on left kneex3 days ago. Pt states the pain is getting worse when putting press ure on it. Pt has trace swelling in left knee. Pt has scabbed superficial abrasion on left knee. PT states has used ace wrap and using ice for it.

## 2024-07-11 ENCOUNTER — Ambulatory Visit: Payer: Self-pay

## 2024-07-11 NOTE — Telephone Encounter (Signed)
 FYI Only or Action Required?: FYI only for provider: appointment scheduled on 11/13.  Patient was last seen in primary care on 04/16/2024 by Frann Mabel Mt, DO.  Called Nurse Triage reporting Knee Pain.  Symptoms began several days ago.  Interventions attempted: Prescription medications: Meloxicam .  Symptoms are: unchanged.  Triage Disposition: See PCP When Office is Open (Within 3 Days)  Patient/caregiver understands and will follow disposition?: Yes       Copied from CRM [id number]. Topic: Clinical - Red Word Triage >> Jul 11, 2024  9:32 AM Suzen RAMAN wrote: Red Word that prompted transfer to Nurse Triage: knee pain seen at [facility] 07/09/24 was advised to follow up with pcp if pain continued. Requesting an appt.      Reason for Disposition  [1] Limp when walking AND [2] due to a direct blow  Answer Assessment - Initial Assessment Questions 1. MECHANISM: How did the injury happen? (e.g., twisting injury, direct blow)      Fall 2. ONSET: When did the injury happen? (e.g., minutes, hours ago)      5 days ago 3. LOCATION: Where is the injury located?      Left knee  4. APPEARANCE of INJURY: What does the injury look like?      Abrasion, swelling, bruising  5. SEVERITY: Can you put weight on that leg? Can you walk?      Able to walk with pain 7. PAIN: Is there pain? If Yes, ask: How bad is the pain?   What does it keep you from doing? (Scale 0-10; or none, mild, moderate, severe)     7/10 8. TETANUS: For any breaks in the skin, ask: When was your last tetanus booster?     2018 9. OTHER SYMPTOMS: Do you have any other symptoms?  (e.g., pop when knee injured, swelling, locking, buckling)      Swelling and bruising  Protocols used: Knee Injury-A-AH

## 2024-07-12 ENCOUNTER — Ambulatory Visit: Admitting: Family Medicine

## 2024-07-12 VITALS — BP 133/73 | HR 78 | Ht 67.0 in | Wt 224.0 lb

## 2024-07-12 DIAGNOSIS — M25562 Pain in left knee: Secondary | ICD-10-CM | POA: Diagnosis not present

## 2024-07-12 NOTE — Progress Notes (Signed)
 Acute Office Visit  Subjective:  Patient ID: Kiara Norman, female    DOB: 1986-07-18  Age: 38 y.o. MRN: [id number]  CC:  Chief Complaint  Patient presents with   Knee Pain      HPI Kiara Norman is here for Left Knee Pain.    Discussed the use of AI scribe software for clinical note transcription with the patient, who gave verbal consent to proceed.  History of Present Illness Kiara Norman is a 38 year old female who presents with left knee pain and swelling following a fall while running.  She experienced a fall while running on Friday, resulting in a direct impact to her left knee. She describes a large abrasion on the knee. Despite this, she continues to experience sharp, shooting, and burning pain around the knee, with limited mobility and a sensation of looseness in the kneecap. She also reports difficulty with weight-bearing activities.  She has a history of left hip pain, which she believes may have contributed to the fall. However, she does not recall any twisting of the knee during the incident. There is no history of chronic knee pain, surgeries, or previous injuries to the knee.  For management, she is using stability sleeves, icing, and elevating the knee. She is taking meloxicam  for inflammation and pain relief. She reports that swelling remains present but improving, and she experiences pain when attempting to fully straighten the knee. No popping, clicking, or locking of the knee during movement, although she notes a popping sensation when standing after sitting for a while.  During the review of symptoms, the pain is primarily in the front of the knee and is most intense medial to the knee cap. No pain in the back of the knee and flexing her calf does not cause pain. She rates her pain as a 7 out of 10 at rest, 10 out of 10 with palpation or movement.  She mentions running frequently as part of her routine, although she does not enjoy it. She is also involved  with the [employer] and has upcoming duty obligations- requesting a note describing physical limitations due to injury.      Past Medical History:  Diagnosis Date   Anemia    GERD (gastroesophageal reflux disease)     Past Surgical History:  Procedure Laterality Date   CESAREAN SECTION     x2   LEEP  2007    Family History  Problem Relation Age of Onset   Arthritis Mother    Rectal cancer Maternal Grandmother    Gastric cancer Maternal Grandmother    Colon cancer Maternal Grandfather    Prostate cancer Maternal Grandfather    Arthritis Paternal Grandfather    Esophageal cancer Neg Hx     Social History   Socioeconomic History   Marital status: Married    Spouse name: Not on file   Number of children: Not on file   Years of education: Not on file   Highest education level: Bachelor's degree (e.g., BA, AB, BS)  Occupational History   Not on file  Tobacco Use   Smoking status: Never   Smokeless tobacco: Never  Vaping Use   Vaping status: Never Used  Substance and Sexual Activity   Alcohol use: Yes    Comment: occasional   Drug use: Never   Sexual activity: Yes    Birth control/protection: Pill  Other Topics Concern   Not on file  Social History Narrative   Not on file   Social  Drivers of Health   Financial Resource Strain: Low Risk  (07/12/2024)   Overall Financial Resource Strain (CARDIA)    Difficulty of Paying Living Expenses: Not hard at all  Food Insecurity: No Food Insecurity (07/12/2024)   Hunger Vital Sign    Worried About Running Out of Food in the Last Year: Never true    Ran Out of Food in the Last Year: Never true  Transportation Needs: No Transportation Needs (07/12/2024)   PRAPARE - Administrator, Civil Service (Medical): No    Lack of Transportation (Non-Medical): No  Physical Activity: Sufficiently Active (07/12/2024)   Exercise Vital Sign    Days of Exercise per Week: 4 days    Minutes of Exercise per Session: 70 min   Stress: Stress Concern Present (07/12/2024)   [employer] of Occupational Health - Occupational Stress Questionnaire    Feeling of Stress: To some extent  Social Connections: Socially Integrated (07/12/2024)   Social Connection and Isolation Panel    Frequency of Communication with Friends and Family: More than three times a week    Frequency of Social Gatherings with Friends and Family: Once a week    Attends Religious Services: More than 4 times per year    Active Member of [employer] or Organizations: Yes    Attends Engineer, Structural: More than 4 times per year    Marital Status: Married  Catering Manager Violence: Not on file    ROS All ROS negative except what is listed in the HPI.   Objective:   Today's Vitals: BP 133/73   Pulse 78   Ht 5' 7 (1.702 m)   Wt 224 lb (101.6 kg)   LMP 07/05/2024   SpO2 99%   BMI 35.08 kg/m   Physical Exam Vitals reviewed.  Constitutional:      Appearance: Normal appearance.  Musculoskeletal:     Left knee: Swelling, ecchymosis and bony tenderness present. Decreased range of motion. Tenderness present.     Comments: Significant pain with light palpation and minimal movement, did not attempt special testing due to pain  Neurological:     Mental Status: She is alert and oriented to person, place, and time.  Psychiatric:        Mood and Affect: Mood normal.        Behavior: Behavior normal.        Thought Content: Thought content normal.        Judgment: Judgment normal.         Assessment & Plan:   Problem List Items Addressed This Visit   None Visit Diagnoses       Acute pain of left knee    -  Primary   Relevant Orders   Ambulatory referral to Sports Medicine      Assessment & Plan Left knee injury Acute contusion with effusion and abrasion post-fall. X-ray negative for fractures.  Differential includes effusion, soft tissue or ligamentous injury. Pain worsens with weight-bearing and any movements. -  Referred to sports medicine for evaluation. - Continue icing, compression, and elevation. - Wear stability brace. - Continue meloxicam . - Avoid mixing meloxicam  with ibuprofen or Aleve . - Use Tylenol for additional pain relief if needed. - No running for two weeks. Provided work restriction note.     Follow-up: Return if symptoms worsen or fail to improve.   Kiara FURY Almarie, DNP, FNP-C  I,Kiara Norman,acting as a neurosurgeon for Kiara KATHEE Almarie, NP.,have documented all relevant documentation on the behalf  of Kiara KATHEE Mon, NP.   I, Kiara KATHEE Mon, NP, have reviewed all documentation for this visit. The documentation on 07/12/2024 for the exam, diagnosis, procedures, and orders are all accurate and complete.

## 2024-07-16 ENCOUNTER — Encounter

## 2024-07-16 NOTE — Progress Notes (Unsigned)
   Subjective:    Patient ID: Kiara Norman, female    DOB: 38 y.o., [birth year]   MRN: [id number]  Chief Complaint: Left knee pain  Discussed the use of AI scribe software for clinical note transcription with the patient, who gave verbal consent to proceed.  History of Present Illness Kiara Norman is a 38 year old female with past medical history significant for IBS, depression, vitamin D deficiency presenting with acute left knee pain.    Review of pertinent imaging: 4 view plain film radiographs obtained of the left knee on 07/09/2024 per my independent review revealing moderate medial compartment joint space narrowing, mild squaring of the superior pole of the patella.  Modest effusion.     Objective:   There were no vitals filed for this visit.  *** Knee (compared to normal) -Inspection: [no] swelling, erythema, deformity or visible effusion. -Palpation: TTP *** quad tendon, *** patella, *** patellar tendon, *** tibial tuberosity, *** pes bursa, *** gerdy tubercle, *** medial joint line, *** lateral joint line, *** posterior knee, *** medial and lateral hamstrings. No significant*** crepitus with flexion/extension. -AROM/PROM: 0*** degrees extension, 150*** degrees flexion, normal*** hamstring flexibility -Strength: stable hip and knee alignment*** with single leg squat, 5/5 flexion, 5/5 extension -Special tests:    -ACL: *** lachman, *** lever test   -MCL: stable and painless with*** valgus at 0/30 degrees   -LCL: stable and painless with*** varus at 0/30 degrees   -PCL: *** sag sign   -Meniscus: *** thessaly, *** McMurray   -Patellofemoral: *** patellar grind     Assessment & Plan:   Assessment & Plan

## 2024-07-19 ENCOUNTER — Ambulatory Visit (INDEPENDENT_AMBULATORY_CARE_PROVIDER_SITE_OTHER)

## 2024-07-19 ENCOUNTER — Other Ambulatory Visit (HOSPITAL_BASED_OUTPATIENT_CLINIC_OR_DEPARTMENT_OTHER): Payer: Self-pay

## 2024-07-19 VITALS — BP 120/80 | Ht 67.0 in | Wt 224.0 lb

## 2024-07-19 DIAGNOSIS — M25562 Pain in left knee: Secondary | ICD-10-CM

## 2024-07-19 DIAGNOSIS — M2392 Unspecified internal derangement of left knee: Secondary | ICD-10-CM | POA: Diagnosis not present

## 2024-07-19 DIAGNOSIS — M6752 Plica syndrome, left knee: Secondary | ICD-10-CM | POA: Diagnosis not present

## 2024-07-19 DIAGNOSIS — M25462 Effusion, left knee: Secondary | ICD-10-CM | POA: Diagnosis not present

## 2024-07-19 MED ORDER — DICLOFENAC SODIUM 75 MG PO TBEC
75.0000 mg | DELAYED_RELEASE_TABLET | Freq: Two times a day (BID) | ORAL | 1 refills | Status: AC
  Filled 2024-07-19: qty 28, 14d supply, fill #0

## 2024-07-19 NOTE — Progress Notes (Addendum)
 Subjective:    Patient ID: Kiara Norman, female    DOB: 38 y.o., 1986/01/17   MRN: [id number]  Chief Complaint: Left knee pain  Discussed the use of AI scribe software for clinical note transcription with the patient, who gave verbal consent to proceed.  History of Present Illness 38 year old female with history of IBS, chronic left hip pain presenting for evaluation of left knee pain after a fall directly onto it on 11/8 while she was running.  X-rays in urgent care on 11/10 negative for fracture or effusion.  Provided meloxicam  and recommended PCP follow-up.  Knee sleeve use, meloxicam , ice helped some but knee still swollen and painful.  Seen 11/13 at her primary care office by Waddell Mon, NP. Pain at that time seemed worse with weightbearing or certain movements, especially extending the knee fully. Recommended continued conservative management with the addition of Tylenol and strict abstinence from running for 2 weeks.   Knee pain and swelling - Acute onset following a fall on asphalt during sprint training - Pain described as burning and uncomfortable, particularly in the superior aspect of the knee - Swelling initially present, now improved - Pain worsens with bending, twisting, and descending stairs - Pain occurs after prolonged sitting - Meloxicam  has been ineffective for pain control - Uses a knee sleeve and ice for swelling  Mechanical symptoms and instability - Difficulty bearing weight after injury - Sensation of instability when descending stairs - Occasional sensation of kneecap looseness - No locking or catching reported  Functional limitations and concerns - Difficulty with activities involving knee flexion and extension - Concerned about ability to participate in upcoming [employer] training in [place] , which involves physical activity similar to a 'mini boot camp'   Review of pertinent imaging: 4 view plain film radiographs obtained of the left  knee on 07/09/2024 per my independent review revealing largely well-preserved joint spaces (mild to moderate medial joint space narrowing notwithstanding).  No acute osseous abnormalities.    Objective:   Vitals:   07/19/24 0804  BP: 120/80    Left Knee (compared to normal) -Inspection: Effusion present.  Large scabbed over abrasion present on the anteromedial portion of the patella. -Palpation: TTP  +quad tendon, + patella, - patellar tendon, - tibial tuberosity, - pes bursa, - gerdy tubercle, + medial joint line, + lateral joint line, - posterior knee, + medial and lateral hamstrings. No significant crepitus with flexion/extension. -AROM/PROM: 0 degrees extension, 115 degrees flexion, normal hamstring flexibility -Strength: Difficulty tolerating single leg squat due to pain, 5/5 flexion, 5/5 extension -Special tests:    -ACL: - lachman, - lever test   -MCL: stable and painless with valgus at 0/30 degrees   -LCL: 1+ and painless with varus at 0/30 degrees   -PCL: - sag sign   -Meniscus: Equivocal thessaly, + McMurray   -Patellofemoral: + patellar grind, patellar translation 1-2 quadrants.  Approximately 1 quadrant or less contralaterally.     Assessment & Plan:   Assessment & Plan Left knee pain and effusion following acute trauma   Acute left knee pain and effusion occurred after a fall during sprint training. The pain is burning and worsens with touch, affecting multiple areas. Swelling has improved, but pain remains, especially during weight-bearing activities. Examination shows tenderness over the plica with possible meniscal involvement. Differential diagnosis includes patellofemoral joint irritation, plica irritation, and potential meniscal injury. There is no evidence of ACL tear or major ligament injury. Extra joint fluid may cause instability and pain.  An MRI is necessary for further evaluation due to persistent symptoms and potential cartilage damage. Ordered an MRI of the left  knee to assess for meniscal injury and other issues. Prescribed stronger analgesics and advised continuing meloxicam , ice application, and a compression sleeve. Referred to physical therapy for rehabilitation. Advised activity modification, including descending stairs one step at a time with the affected leg first. A follow-up will be scheduled after MRI results are available.

## 2024-07-24 ENCOUNTER — Ambulatory Visit (HOSPITAL_COMMUNITY)

## 2024-07-30 ENCOUNTER — Other Ambulatory Visit (HOSPITAL_BASED_OUTPATIENT_CLINIC_OR_DEPARTMENT_OTHER): Payer: Self-pay

## 2024-07-31 ENCOUNTER — Ambulatory Visit (HOSPITAL_COMMUNITY): Admission: RE | Admit: 2024-07-31 | Discharge: 2024-07-31 | Disposition: A | Source: Ambulatory Visit

## 2024-07-31 DIAGNOSIS — M25562 Pain in left knee: Secondary | ICD-10-CM | POA: Diagnosis present

## 2024-07-31 DIAGNOSIS — M25462 Effusion, left knee: Secondary | ICD-10-CM | POA: Diagnosis present

## 2024-07-31 DIAGNOSIS — M6752 Plica syndrome, left knee: Secondary | ICD-10-CM | POA: Insufficient documentation

## 2024-08-05 ENCOUNTER — Ambulatory Visit: Payer: Self-pay

## 2024-09-19 ENCOUNTER — Inpatient Hospital Stay (HOSPITAL_BASED_OUTPATIENT_CLINIC_OR_DEPARTMENT_OTHER)
Admission: AD | Admit: 2024-09-19 | Discharge: 2024-09-19 | Disposition: A | Discharge status: Home / Self Care | Attending: Obstetrics and Gynecology | Admitting: Obstetrics and Gynecology

## 2024-09-19 ENCOUNTER — Other Ambulatory Visit: Payer: Self-pay

## 2024-09-19 ENCOUNTER — Inpatient Hospital Stay (HOSPITAL_COMMUNITY)

## 2024-09-19 ENCOUNTER — Encounter (HOSPITAL_BASED_OUTPATIENT_CLINIC_OR_DEPARTMENT_OTHER): Payer: Self-pay | Admitting: Emergency Medicine

## 2024-09-19 DIAGNOSIS — Z3A01 Less than 8 weeks gestation of pregnancy: Secondary | ICD-10-CM

## 2024-09-19 DIAGNOSIS — O4691 Antepartum hemorrhage, unspecified, first trimester: Secondary | ICD-10-CM | POA: Diagnosis not present

## 2024-09-19 DIAGNOSIS — Z3201 Encounter for pregnancy test, result positive: Secondary | ICD-10-CM | POA: Diagnosis present

## 2024-09-19 DIAGNOSIS — O209 Hemorrhage in early pregnancy, unspecified: Secondary | ICD-10-CM | POA: Insufficient documentation

## 2024-09-19 DIAGNOSIS — O4690 Antepartum hemorrhage, unspecified, unspecified trimester: Secondary | ICD-10-CM | POA: Diagnosis present

## 2024-09-19 DIAGNOSIS — O3680X0 Pregnancy with inconclusive fetal viability, not applicable or unspecified: Secondary | ICD-10-CM | POA: Insufficient documentation

## 2024-09-19 LAB — URINALYSIS, ROUTINE W REFLEX MICROSCOPIC
Bilirubin Urine: NEGATIVE
Glucose, UA: NEGATIVE mg/dL
Ketones, ur: NEGATIVE mg/dL
Leukocytes,Ua: NEGATIVE
Nitrite: NEGATIVE
Protein, ur: NEGATIVE mg/dL
Specific Gravity, Urine: 1.01 (ref 1.005–1.030)
pH: 5.5 (ref 5.0–8.0)

## 2024-09-19 LAB — CBC WITH DIFFERENTIAL/PLATELET
Basophils Absolute: 0.1 K/uL (ref 0.0–0.1)
Basophils Relative: 1 %
Eosinophils Absolute: 0.1 K/uL (ref 0.0–0.5)
Eosinophils Relative: 1 %
HCT: 39.9 % (ref 36.0–46.0)
Hemoglobin: 13.4 g/dL (ref 12.0–15.0)
Lymphocytes Relative: 30 %
Lymphs Abs: 2.7 K/uL (ref 0.7–4.0)
MCH: 31.2 pg (ref 26.0–34.0)
MCHC: 33.6 g/dL (ref 30.0–36.0)
MCV: 92.8 fL (ref 80.0–100.0)
Monocytes Absolute: 0.6 K/uL (ref 0.1–1.0)
Monocytes Relative: 6 %
Neutro Abs: 5.5 K/uL (ref 1.7–7.7)
Neutrophils Relative %: 62 %
Platelets: 296 K/uL (ref 150–400)
RBC: 4.3 MIL/uL (ref 3.87–5.11)
RDW: 13 % (ref 11.5–15.5)
WBC: 9 K/uL (ref 4.0–10.5)

## 2024-09-19 LAB — URINALYSIS, MICROSCOPIC (REFLEX): WBC, UA: NONE SEEN WBC/hpf (ref 0–5)

## 2024-09-19 LAB — HCG, QUANTITATIVE, PREGNANCY: hCG, Beta Chain, Quant, S: 38 m[IU]/mL — ABNORMAL HIGH

## 2024-09-19 LAB — PREGNANCY, URINE: Preg Test, Ur: POSITIVE — AB

## 2024-09-19 NOTE — MAU Note (Signed)
 Kiara Norman is a 39 y.o. at Unknown here in MAU reporting: Had positive home UPT. Went to gym this morning - had cramping and a little bit of blood. Cramping and bleeding continued so she was seen at [facility] office - transvaginal US  - no IUP seen and was told to be seen in ER to rule out ectopic. Here from [facility] for US . Reports VB is a little heavier than what my period would be - passed 1 small clot.   LMP: 08/14/24 Onset of complaint: 0930 Pain score: 7 - abdomen  Vitals:   09/19/24 1716 09/19/24 1939  BP: 123/78 117/68  Pulse: 79 83  Resp: 15 18  Temp: 97.9 F (36.6 C) 98.9 F (37.2 C)  SpO2: 100% 100%     FHT: NA  Lab orders placed from triage: none

## 2024-09-19 NOTE — MAU Provider Note (Addendum)
"   None     S Ms. Mallie Ahniya Mitchum is a 39 y.o. G1P0 pregnant female at [redacted]w[redacted]d who presents to MAU today with complaint of positive home UPT. She was at the gym this morning and noted some bleeding and cramping. She was seen at [facility] office - with no IUP visualized, and sent to ED for further evaluation. She was seen initially at [facility], and transferred here for further workup. She endorses bleeding heavier than normal period and cramping 7/10. Declines medicine here.     Pertinent items noted in HPI and remainder of comprehensive ROS otherwise negative.   O BP 117/68 (BP Location: Right Arm)   Pulse 83   Temp 98.9 F (37.2 C)   Resp 18   Ht 5' 7 (1.702 m)   Wt 104.6 kg   LMP 08/14/2024 (Exact Date)   SpO2 100%   BMI 36.10 kg/m  Physical Exam Vitals reviewed.  Constitutional:      General: She is not in acute distress.    Appearance: Normal appearance. She is not ill-appearing, toxic-appearing or diaphoretic.  HENT:     Head: Normocephalic.  Cardiovascular:     Rate and Rhythm: Normal rate.     Pulses: Normal pulses.  Pulmonary:     Effort: Pulmonary effort is normal.  Abdominal:     Tenderness: There is no abdominal tenderness.  Skin:    General: Skin is warm and dry.     Capillary Refill: Capillary refill takes less than 2 seconds.  Neurological:     General: No focal deficit present.     Mental Status: She is alert and oriented to person, place, and time.  Psychiatric:        Mood and Affect: Mood normal.        Behavior: Behavior normal.        Thought Content: Thought content normal.        Judgment: Judgment normal.      MDM: Moderate Evaluate for ectopic. No IUP visualized on US . Likewise, no evidence of ectopic.  Beta HcG 38. Consulted Dr. Cleatus who agreed follow up beta HcG appropriate Saturday morning (just over 48 hours).   MAU Course:  A Vaginal bleeding in pregnancy - Plan: Discharge patient  Medical screening exam complete  P Discharge from  MAU in stable condition with bleeding and infection precautions Follow up at MAU for trending beta quant 09/22/24.   Allergies as of 09/19/2024   No Known Allergies      Medication List     STOP taking these medications    norgestimate -ethinyl estradiol  0.25-35 MG-MCG tablet Commonly known as: ORTHO-CYCLEN       TAKE these medications    diclofenac  75 MG EC tablet Commonly known as: VOLTAREN  Take 1 tablet (75 mg total) by mouth 2 (two) times daily.   pantoprazole  40 MG tablet Commonly known as: PROTONIX  Take 1 tablet (40 mg total) by mouth daily.        Camie Rote, MSN, CNM 09/19/2024 9:28 PM  Certified Nurse Midwife, [facility]  "

## 2024-09-19 NOTE — ED Notes (Signed)
 Patient transferred from waiting room to ED treatment room. Assuming pt care at this time.

## 2024-09-19 NOTE — Discharge Instructions (Addendum)
 Ms. Eland,  It was a pleasure meeting you tonight.   Please return to: [facility] at [facility] [address] [place], [place], [place] [address] On 09/22/24 in the morning for lab work.   Thank you for trusting us  to care for you, Camie, Midwife

## 2024-09-19 NOTE — ED Provider Notes (Signed)
 " [place] [facility] AT [facility] Provider Note   CSN: [id number] Arrival date & time: 09/19/24  1709     Patient presents with: Vaginal Bleeding   Kiara Norman is a 39 y.o. female with history of anemia, presents with concern for vaginal bleeding that started this morning.  She reports a positive home pregnancy test today.  Bleeding has progressively gotten heavier throughout the day and she did notice a clot that passed just prior to arrival.  She also reports some left-sided abdominal pain and nausea.  No fevers.  She was seen at her PCPs office just prior to arrival and told to come to the ER to get an ultrasound to evaluate for the location of her pregnancy.  LMP December 19th 2025    Vaginal Bleeding      Prior to Admission medications  Medication Sig Start Date End Date Taking? Authorizing Provider  diclofenac  (VOLTAREN ) 75 MG EC tablet Take 1 tablet (75 mg total) by mouth 2 (two) times daily. 07/19/24   Gottwalt, Redell A, DO  norgestimate -ethinyl estradiol  (ORTHO-CYCLEN) 0.25-35 MG-MCG tablet Take 1 tablet by mouth daily. 05/18/23   Frann Mabel Mt, DO  pantoprazole  (PROTONIX ) 40 MG tablet Take 1 tablet (40 mg total) by mouth daily. 11/24/21   Frann Mabel Mt, DO    Allergies: Patient has no known allergies.    Review of Systems  Genitourinary:  Positive for vaginal bleeding.    Updated Vital Signs BP 123/78   Pulse 79   Temp 97.9 F (36.6 C) (Oral)   Resp 15   Ht 5' 7 (1.702 m)   Wt 100.7 kg   LMP 08/14/2024 (Exact Date)   SpO2 100%   BMI 34.77 kg/m   Physical Exam Vitals and nursing note reviewed. Exam conducted with a chaperone present.  Constitutional:      Appearance: Normal appearance.  HENT:     Head: Atraumatic.  Cardiovascular:     Rate and Rhythm: Normal rate and regular rhythm.  Pulmonary:     Effort: Pulmonary effort is normal.  Abdominal:     General: Abdomen is flat.     Palpations: Abdomen is  soft.     Tenderness: There is no abdominal tenderness.     Comments: Abdomen is soft and non-tender  Genitourinary:    Comments: RN Suzen Land present to chaperone pelvic exam Unable to visualize cervix. Moderate amount of blood in vaginal vault.  Neurological:     General: No focal deficit present.     Mental Status: She is alert.  Psychiatric:        Mood and Affect: Mood normal.        Behavior: Behavior normal.     (all labs ordered are listed, but only abnormal results are displayed) Labs Reviewed  URINALYSIS, ROUTINE W REFLEX MICROSCOPIC - Abnormal; Notable for the following components:      Result Value   Hgb urine dipstick LARGE (*)    All other components within normal limits  HCG, QUANTITATIVE, PREGNANCY - Abnormal; Notable for the following components:   hCG, Beta Chain, Quant, S 38 (*)    All other components within normal limits  PREGNANCY, URINE - Abnormal; Notable for the following components:   Preg Test, Ur POSITIVE (*)    All other components within normal limits  URINALYSIS, MICROSCOPIC (REFLEX) - Abnormal; Notable for the following components:   Bacteria, UA RARE (*)    All other components within normal limits  CBC  WITH DIFFERENTIAL/PLATELET    EKG: None  Radiology: No results found.   Procedures   Medications Ordered in the ED - No data to display  Clinical Course as of 09/19/24 1839  Wed Sep 19, 2024  1827 Contacted MAU APP [facility] who recommends transfer the patient for pelvic ultrasound [AF]    Clinical Course User Index [AF] Veta Palma, PA-C                                 Medical Decision Making Amount and/or Complexity of Data Reviewed Labs: ordered.     Differential diagnosis includes but is not limited to  inevitable abortion, incomplete abortion, septic abortion, subchorionic hemorrhage, ectopic pregnancy, blood loss anemia, UTI, menstrual bleeding, abnormal uterine bleeding  ED Course:  Upon initial evaluation,  patient is well appearing, no acute distress. Normal vitals. Abdomen soft and non-tender. Moderate amount of bleeding on pelvic exam. No clots noted on my exam. I was unable to visualize patient's cervix.  Patient with positive urine pregnancy. Quantitative Beta hcg still pending. Unfortunately, we do not have ultrasound available right now at [facility] ER. Given her positive pregnancy test and vaginal bleeding, I feel patient will need formal ultrasound to evaluate the location of her pregnancy/evaluate for possible miscarriage or ectopic pregnancy. I called the Women's unit at [facility] (MAU) and spoke to the APP and attending, they recommend transfer for pelvic ultrasound.   Patient hemodynamically stable and appropriate for POV transfer to MAU at this time  Labs Ordered: I Ordered, and personally interpreted labs.  The pertinent results include:   CBC with stable hemoglobin at 13.4.  No leukocytosis Urinalysis with negative nitrates and leukocytes.  Red blood cells noted.  Bacteria noted on reflex Urine pregnancy positive Quantitative beta hcg 38  Consultations Obtained: I requested consultation with the MAU APP Olam and her attending,  and discussed lab and imaging findings as well as pertinent plan - they recommend: Transfer for pelvic ultrasound  Medications Given: None  Impression: Vaginal bleeding in pregnancy  Disposition:  Patient to transfer POV to the MAU unit at [place] for formal OB ultrasound. Husband is at bedside and will drive patient. Patient hemodynamically stable at time of transfer  This chart was dictated using voice recognition software, Dragon. Despite the best efforts of this provider to proofread and correct errors, errors may still occur which can change documentation meaning.       Final diagnoses:  Vaginal bleeding in pregnancy    ED Discharge Orders     None          Veta Palma, [place] 09/19/24 1839    Lenor Hollering,  MD 09/19/24 2330  "

## 2024-09-19 NOTE — ED Triage Notes (Signed)
 Pt reports + home preg test, new vaginal bleeding today, seen at pcp and had US  today.

## 2024-09-22 ENCOUNTER — Inpatient Hospital Stay (HOSPITAL_COMMUNITY)
Admission: AD | Admit: 2024-09-22 | Discharge: 2024-09-22 | Disposition: A | Discharge status: Home / Self Care | Payer: Self-pay | Attending: Obstetrics and Gynecology | Admitting: Obstetrics and Gynecology

## 2024-09-22 ENCOUNTER — Inpatient Hospital Stay (HOSPITAL_COMMUNITY)

## 2024-09-22 ENCOUNTER — Other Ambulatory Visit: Payer: Self-pay

## 2024-09-22 DIAGNOSIS — O039 Complete or unspecified spontaneous abortion without complication: Secondary | ICD-10-CM | POA: Insufficient documentation

## 2024-09-22 LAB — HCG, QUANTITATIVE, PREGNANCY: hCG, Beta Chain, Quant, S: 4 m[IU]/mL

## 2024-09-22 LAB — COMPREHENSIVE METABOLIC PANEL WITH GFR
ALT: 19 U/L (ref 0–44)
AST: 18 U/L (ref 15–41)
Albumin: 4.2 g/dL (ref 3.5–5.0)
Alkaline Phosphatase: 53 U/L (ref 38–126)
Anion gap: 9 (ref 5–15)
BUN: 14 mg/dL (ref 6–20)
CO2: 23 mmol/L (ref 22–32)
Calcium: 9.2 mg/dL (ref 8.9–10.3)
Chloride: 107 mmol/L (ref 98–111)
Creatinine, Ser: 0.86 mg/dL (ref 0.44–1.00)
GFR, Estimated: >60 mL/min
Glucose, Bld: 109 mg/dL — ABNORMAL HIGH (ref 70–99)
Potassium: 4.6 mmol/L (ref 3.5–5.1)
Sodium: 139 mmol/L (ref 135–145)
Total Bilirubin: 0.3 mg/dL (ref 0.0–1.2)
Total Protein: 6.7 g/dL (ref 6.5–8.1)

## 2024-09-22 NOTE — MAU Provider Note (Signed)
"   Event Date/Time   First Provider Initiated Contact with Patient 09/22/24 [phone]      S Ms. Kiara Norman is a 39 y.o. G1P0 non-pregnant female at [redacted]w[redacted]d who presents to MAU today with complaint of presents for a beta HCG redraw.  She has been experiencing heavy bleeding with clots for the last two days, it has become light this morning.  She has no pain today.   Receives care at [facility]. Prenatal records reviewed.  Pertinent items noted in HPI and remainder of comprehensive ROS otherwise negative.   O BP 118/71 (BP Location: Right Arm)   Pulse 74   Temp 98.6 F (37 C) (Oral)   Resp 20   Ht 5' 7 (1.702 m)   Wt 105.3 kg   LMP 08/14/2024 (Exact Date)   SpO2 100%   BMI 36.37 kg/m  Physical Exam Vitals and nursing note reviewed.  Constitutional:      General: She is not in acute distress.    Appearance: She is well-developed.  Pulmonary:     Effort: Pulmonary effort is normal.  Neurological:     General: No focal deficit present.     Mental Status: She is alert.  Psychiatric:        Mood and Affect: Mood normal.        Behavior: Behavior normal.    Results for orders placed or performed during the hospital encounter of 09/22/24 (from the past 24 hours)  hCG, quantitative, pregnancy     Status: None   Collection Time: 09/22/24  8:45 AM  Result Value Ref Range   hCG, Beta Chain, Quant, S 4 <5 mIU/mL  Comprehensive metabolic panel     Status: Abnormal   Collection Time: 09/22/24  8:45 AM  Result Value Ref Range   Sodium 139 135 - 145 mmol/L   Potassium 4.6 3.5 - 5.1 mmol/L   Chloride 107 98 - 111 mmol/L   CO2 23 22 - 32 mmol/L   Glucose, Bld 109 (H) 70 - 99 mg/dL   BUN 14 6 - 20 mg/dL   Creatinine, Ser 9.13 0.44 - 1.00 mg/dL   Calcium 9.2 8.9 - 89.6 mg/dL   Total Protein 6.7 6.5 - 8.1 g/dL   Albumin 4.2 3.5 - 5.0 g/dL   AST 18 15 - 41 U/L   ALT 19 0 - 44 U/L   Alkaline Phosphatase 53 38 - 126 U/L   Total Bilirubin 0.3 0.0 - 1.2 mg/dL   GFR,  Estimated >39 >39 mL/min   Anion gap 9 5 - 15    MDM: Low MAU Course: Previous HCG was 38 today meets criteria for failed pregnancy.  A 1. Miscarriage (Primary) - Discharge patient   Medical screening exam complete  P Discharge from MAU in stable condition with routine precautions Follow up at [facility] as needed  No future appointments. Allergies as of 09/22/2024   No Known Allergies      Medication List     TAKE these medications    diclofenac  75 MG EC tablet Commonly known as: VOLTAREN  Take 1 tablet (75 mg total) by mouth 2 (two) times daily.   pantoprazole  40 MG tablet Commonly known as: PROTONIX  Take 1 tablet (40 mg total) by mouth daily.        Kiara Norman, Tawni SAUNDERS, [place] 09/22/2024 10:28 AM   "

## 2024-09-22 NOTE — MAU Note (Signed)
 Kiara Norman is a 39 y.o. at [redacted]w[redacted]d here in MAU reporting: she's here for repeat labs.  Denies pain, endorses spotting today.  Reports was bleeding heavier and passing clots yesterday.  LMP: 08/14/2024 Onset of complaint: ongoing Pain score: 0 Vitals:   09/22/24 0824  BP: 118/71  Pulse: 74  Resp: 20  Temp: 98.6 F (37 C)  SpO2: 100%     FHT: NA  Lab orders placed from triage: HCG
